# Patient Record
Sex: Female | Born: 1980 | Race: Black or African American | Hispanic: No | Marital: Married | Smoking: Current every day smoker
Health system: Southern US, Community
[De-identification: ages and names within clinical notes are randomized; demographics above are authoritative.]

## PROBLEM LIST (undated history)

## (undated) DIAGNOSIS — E0500 Thyrotoxicosis with diffuse goiter without thyrotoxic crisis or storm: Secondary | ICD-10-CM

## (undated) DIAGNOSIS — I10 Essential (primary) hypertension: Secondary | ICD-10-CM

## (undated) HISTORY — PX: TUBAL LIGATION: SHX77

---

## 2006-08-23 ENCOUNTER — Emergency Department (HOSPITAL_COMMUNITY): Admission: EM | Admit: 2006-08-23 | Discharge: 2006-08-23 | Payer: Self-pay | Admitting: Emergency Medicine

## 2006-09-12 ENCOUNTER — Ambulatory Visit (HOSPITAL_COMMUNITY): Admission: AD | Admit: 2006-09-12 | Discharge: 2006-09-12 | Payer: Self-pay | Admitting: Obstetrics and Gynecology

## 2006-09-23 ENCOUNTER — Inpatient Hospital Stay (HOSPITAL_COMMUNITY): Admission: RE | Admit: 2006-09-23 | Discharge: 2006-09-25 | Payer: Self-pay | Admitting: Obstetrics and Gynecology

## 2006-09-23 ENCOUNTER — Encounter (INDEPENDENT_AMBULATORY_CARE_PROVIDER_SITE_OTHER): Payer: Self-pay | Admitting: *Deleted

## 2009-01-04 ENCOUNTER — Ambulatory Visit (HOSPITAL_COMMUNITY): Admission: RE | Admit: 2009-01-04 | Discharge: 2009-01-04 | Payer: Self-pay | Admitting: Family Medicine

## 2011-01-19 NOTE — Op Note (Signed)
NAME:  Kellie Bautista, Kellie Bautista NO.:  [id number]   MEDICAL RECORD NO.:  [id number]          PATIENT TYPE:  INP   LOCATION:  A401                          FACILITY:  [facility]   PHYSICIAN:  Tilda Burrow, M.D. DATE OF BIRTH:  [birth year]   DATE OF PROCEDURE:  DATE OF DISCHARGE:                               OPERATIVE REPORT   ADMISSION DIAGNOSIS:  Pregnancy, 37 weeks, 6 days.  Repeat cesarean  section with elective sterilization, umbilical hernia postoperative.   DISCHARGE DIAGNOSES:  Pregnancy of 37 weeks, 6 days, delivered.   PROCEDURE:  Repeat low transverse cervical cesarean section, bilateral  salpingectomy.   HISTORY OF PRESENT ILLNESS:  This 30 year old gravida 8, para 6, 0-1-5,  prior C-section x1, VBAC x2, who was admitted for repeat C-section and  tubal ligation after being admitted at 6:00 a.m. on September 23, 2006 in  early labor.  She has had minimal prenatal care.  Saw Dr. __________ in  [place] x2 and in our office once or twice.   PAST MEDICAL HISTORY:  Benign.   PHYSICAL EXAMINATION:  VITAL SIGNS:  Height 5 feet 4 inches.  Weight  150.  GENERAL:  Alert and oriented.  A 36 cm fundal height.  Estimated fetal  weight 5 to 5-1/2 pounds.   HOSPITAL COURSE:  Patient was admitted.  Taken promptly to cesarean  section at 9:00 a.m. on January 21.  Delivering a 4 pound, 10 ounce  female infant.  Apgars 9 and 9.   Postoperative course was uneventful with postop hemoglobin 10,  hematocrit 31, compared to 12.4 and 37 on admission.  Blood gas on the  baby was pH was 7.34, pCO2 51, pO2 18.  Blood type was B+.   Discharged on January 23 for followup in five days, staple removal.      Tilda Burrow, M.D.  Electronically Signed     JVF/MEDQ  D:  09/25/2006  T:  09/25/2006  Job:  [id number]   cc:   Jeoffrey Massed, MD  Fax: [phone]

## 2011-01-19 NOTE — Group Therapy Note (Signed)
[name], [name] NO.:  [id number]   MEDICAL RECORD NO.:  [id number]          PATIENT TYPE:  OIB   LOCATION:  LDR4                          FACILITY:  [facility]   PHYSICIAN:  Tilda Burrow, M.D. DATE OF BIRTH:  Jan 25, 1981   DATE OF PROCEDURE:  DATE OF DISCHARGE:  09/12/2006                                 PROGRESS NOTE   Kellie Bautista is a 30 year old gravida 8, para 6, AB 1 living 5 with an EDC of  October 08, 2005 based on last menstrual period.  She has only had two  visits in our office, which was at 20 and 21 weeks respectively.  She  states that she cannot find her Medicaid card and therefore has not been  back.  I told her that this was not an issue during pregnancy and I left  a message on her answering machine for the secretaries to be expecting  her call regarding an appointment.  She did have a C-section in the  middle of all these babies so she is up for repeat C-section with tubal.  Her chief complaint today is gray discharge and some sharp shooting  vaginal pain.  A speculum exam reveals a yeast infection.  Her cervix is  1 cm just real multiparous and presenting part is at -2 station.  She is  not having any contractions.  The fetal heart rate is reactive.  She is  [redacted] weeks gestation.  She is being discharged home with a prescription  for Diflucan and a diagnosis of most likely some nerve irritation when  the baby moves.      Kellie Bautista, C.N.M.      Tilda Burrow, M.D.  Electronically Signed    FC/MEDQ  D:  09/12/2006  T:  09/13/2006  Job:  [id number]   cc:   [facility]

## 2011-01-19 NOTE — Op Note (Signed)
NAME:  KELCY, LAIBLE NO.:  [id number]   MEDICAL RECORD NO.:  [id number]          PATIENT TYPE:  INP   LOCATION:  A401                          FACILITY:  [facility]   PHYSICIAN:  Tilda Burrow, M.D. DATE OF BIRTH:  1981/04/26   DATE OF PROCEDURE:  09/23/2006  DATE OF DISCHARGE:                               OPERATIVE REPORT   PREOPERATIVE DIAGNOSES:  1. Pregnancy, 37-1/2 weeks' gestation.  2. Prior cesarean section, now for a trial of labor.  3. Early labor.  4. Elective sterilization.   POSTOPERATIVE DIAGNOSES:  1. Pregnancy, 37-1/2 weeks' gestation.  2. Prior cesarean section, now for a trial of labor.  3. Early labor.  4. Elective sterilization.   PROCEDURES:  1. Repeat low transverse cesarean section.  2. Bilateral partial salpingectomy.   SURGEON:  Tilda Burrow, M.D.   ASSISTANTAnnabell Howells, R.N., Whitt, C.S.T.   ANESTHESIA:  Spinal, Glynn Octave, C.R.N.A.   COMPLICATIONS:  Left uterine vein bleeding, controlled during surgery.   FINDINGS:  Thin lower uterine segment, 1-2 mm thickness.  A 4 pound 10.6  ounce female, Apgars 9 and 9.   DETAILS OF PROCEDURE:  The patient was taken to the operating room,  anesthesia introduced, and a Pfannenstiel-type incision repeated with  excision of the old cicatrix.  The fascia was opened in a transverse  fashion, a flap developed, and the peritoneal cavity entered.  The  bladder flap was developed on the lower uterine segment, which was  relatively thinned out.  A transverse incision on the lower uterine  segment was performed at a spot that was no more than 1-2 mm thick.  The  transverse incision using index finger traction was followed by breaking  of the forewaters, clear amniotic fluid encountered, then fetal vertex  expelled by fundal pressure with manual guidance of the vertex.  Cord  was clamped.  There was a body cord x1 as well as a nuchal cord.  The  infant was passed to the waiting pediatrician,  Dr. Milinda Cave, in good  condition.  See his notes for details.  Subsequent weight was 4 pounds  10.6 ounces, Apgars 9 and 9 assigned.   Cord blood samples were obtained.  Placenta delivered by Crede massage.  The uterus was inspected and there was generous bleeding from the left  uterine vein, which was controlled with a Heaney-type clamp and a figure-  of-eight suture.  The lower uterine segment incision was then irrigated,  inspected, and closed using running 0 chromic single-layer closure.  Hemostasis was quite satisfactory.  Chromic 2-0 was used to oversew the  bladder flap.   Tubal ligation:  Tubal ligation was then performed by grasping a  midsegment knuckle of the right tube, elevating it, doubly ligating  around a midsegment knuckle of the tube, and then excising the  incarcerated knuckle of tube for histology.  Hemostasis was confirmed.  The left tube was treated similarly.  The abdomen was irrigated.  There  was a generous amount of intra-abdominal blood from the venous oozing  earlier in the case, and this was irrigated out  generously.  At the end  of this the fluid was relatively clear.  The anterior peritoneum was  inspected.  There were some omental attachments to the anterior  peritoneum suggesting that the peritoneum had not been closed at the  last case.  The pedicles were crossclamped and ligated with 2-0 plain.  The anterior peritoneum was then closed in a continuous running fashion,  the fascia then trimmed of some irregularities and fibrosis and then  closed in a continuous running 0 Vicryl.  The subcutaneous fatty tissues  were inspected, confirmed as adequately hemostatic.  Subcuticular 4-0  Dexon skin closure performed, subcuticular 2-0 plain skin closure x3  sites was performed with interrupted sutures, followed by staple closure  of the entire incision.  The patient tolerated the procedure well and  went to the recovery room in good condition with sponge and  needle  counts correct, with dressing in place.  Foley catheter revealed clear  urine post procedure.      Tilda Burrow, M.D.  Electronically Signed     JVF/MEDQ  D:  09/23/2006  T:  09/23/2006  Job:  [id number]   cc:   Jeoffrey Massed, MD  Fax: [phone]

## 2013-04-21 DIAGNOSIS — E0590 Thyrotoxicosis, unspecified without thyrotoxic crisis or storm: Secondary | ICD-10-CM | POA: Insufficient documentation

## 2013-07-13 DIAGNOSIS — E0500 Thyrotoxicosis with diffuse goiter without thyrotoxic crisis or storm: Secondary | ICD-10-CM | POA: Insufficient documentation

## 2016-02-02 DIAGNOSIS — E049 Nontoxic goiter, unspecified: Secondary | ICD-10-CM | POA: Insufficient documentation

## 2020-12-09 ENCOUNTER — Other Ambulatory Visit: Payer: Self-pay

## 2020-12-09 ENCOUNTER — Emergency Department (HOSPITAL_COMMUNITY)
Admission: EM | Admit: 2020-12-09 | Discharge: 2020-12-09 | Disposition: A | Discharge status: Home / Self Care | Payer: Medicaid - Out of State | Attending: Emergency Medicine | Admitting: Emergency Medicine

## 2020-12-09 ENCOUNTER — Encounter (HOSPITAL_COMMUNITY): Payer: Self-pay | Admitting: Emergency Medicine

## 2020-12-09 ENCOUNTER — Encounter (HOSPITAL_COMMUNITY): Payer: Self-pay

## 2020-12-09 ENCOUNTER — Emergency Department (HOSPITAL_COMMUNITY)
Admission: EM | Admit: 2020-12-09 | Discharge: 2020-12-10 | Disposition: A | Discharge status: Home / Self Care | Payer: Medicaid - Out of State | Source: Home / Self Care

## 2020-12-09 DIAGNOSIS — M542 Cervicalgia: Secondary | ICD-10-CM | POA: Insufficient documentation

## 2020-12-09 DIAGNOSIS — M79601 Pain in right arm: Secondary | ICD-10-CM | POA: Insufficient documentation

## 2020-12-09 DIAGNOSIS — I10 Essential (primary) hypertension: Secondary | ICD-10-CM | POA: Insufficient documentation

## 2020-12-09 DIAGNOSIS — R42 Dizziness and giddiness: Secondary | ICD-10-CM | POA: Insufficient documentation

## 2020-12-09 DIAGNOSIS — R531 Weakness: Secondary | ICD-10-CM | POA: Insufficient documentation

## 2020-12-09 DIAGNOSIS — Z5321 Procedure and treatment not carried out due to patient leaving prior to being seen by health care provider: Secondary | ICD-10-CM | POA: Insufficient documentation

## 2020-12-09 DIAGNOSIS — R519 Headache, unspecified: Secondary | ICD-10-CM | POA: Diagnosis present

## 2020-12-09 HISTORY — DX: Essential (primary) hypertension: I10

## 2020-12-09 HISTORY — DX: Thyrotoxicosis with diffuse goiter without thyrotoxic crisis or storm: E05.00

## 2020-12-09 NOTE — ED Triage Notes (Signed)
Patient here from home reporting ongoing headache and hypertension. Reports that she does not take meds. Patient does not talk much.

## 2020-12-09 NOTE — ED Notes (Signed)
Pt denies dizziness at this time. States she is having a headache. Reports it feels like previous headaches she has had when her BP goes up.

## 2020-12-09 NOTE — ED Triage Notes (Signed)
Pt reports that her BP read high, became dizzy, headache, pain in neck, pain to right arm, generalized weakness.  Started about 8-9pm. History of high BP and is not taking any medication for it.

## 2020-12-10 ENCOUNTER — Ambulatory Visit (HOSPITAL_COMMUNITY): Payer: Medicaid - Out of State

## 2020-12-10 ENCOUNTER — Emergency Department (HOSPITAL_COMMUNITY): Payer: Medicaid - Out of State

## 2020-12-10 ENCOUNTER — Ambulatory Visit (HOSPITAL_COMMUNITY): Admission: RE | Admit: 2020-12-10 | Payer: Medicaid - Out of State | Source: Ambulatory Visit

## 2020-12-10 LAB — BASIC METABOLIC PANEL
Anion gap: 8 (ref 5–15)
BUN: 8 mg/dL (ref 6–20)
CO2: 25 mmol/L (ref 22–32)
Calcium: 9.3 mg/dL (ref 8.9–10.3)
Chloride: 104 mmol/L (ref 98–111)
Creatinine, Ser: 0.76 mg/dL (ref 0.44–1.00)
GFR, Estimated: >60 mL/min (ref >60)
Glucose, Bld: 98 mg/dL (ref 70–99)
Potassium: 3.3 mmol/L — ABNORMAL LOW (ref 3.5–5.1)
Sodium: 137 mmol/L (ref 135–145)

## 2020-12-10 LAB — URINALYSIS, ROUTINE W REFLEX MICROSCOPIC
Bacteria, UA: NONE SEEN
Bilirubin Urine: NEGATIVE
Glucose, UA: NEGATIVE mg/dL
Ketones, ur: NEGATIVE mg/dL
Nitrite: NEGATIVE
Protein, ur: NEGATIVE mg/dL
RBC / HPF: >50 RBC/hpf — ABNORMAL HIGH (ref 0–5)
Specific Gravity, Urine: 1.006 (ref 1.005–1.030)
pH: 7 (ref 5.0–8.0)

## 2020-12-10 LAB — CBC
HCT: 40.4 % (ref 36.0–46.0)
Hemoglobin: 13.6 g/dL (ref 12.0–15.0)
MCH: 27.9 pg (ref 26.0–34.0)
MCHC: 33.7 g/dL (ref 30.0–36.0)
MCV: 82.8 fL (ref 80.0–100.0)
Platelets: 226 10*3/uL (ref 150–400)
RBC: 4.88 MIL/uL (ref 3.87–5.11)
RDW: 13.9 % (ref 11.5–15.5)
WBC: 8.3 10*3/uL (ref 4.0–10.5)
nRBC: 0 % (ref 0.0–0.2)

## 2020-12-10 LAB — I-STAT BETA HCG BLOOD, ED (MC, WL, AP ONLY): I-stat hCG, quantitative: <5 m[IU]/mL (ref <5)

## 2020-12-10 MED ORDER — SODIUM CHLORIDE 0.9% FLUSH: 3.0000 mL | Freq: Once | INTRAVENOUS | Status: DC

## 2021-04-05 ENCOUNTER — Encounter (HOSPITAL_COMMUNITY): Payer: Self-pay

## 2021-04-05 ENCOUNTER — Other Ambulatory Visit: Payer: Self-pay

## 2021-04-05 ENCOUNTER — Emergency Department (HOSPITAL_COMMUNITY): Payer: Medicaid - Out of State

## 2021-04-05 ENCOUNTER — Emergency Department (HOSPITAL_COMMUNITY)
Admission: EM | Admit: 2021-04-05 | Discharge: 2021-04-05 | Disposition: A | Discharge status: Home / Self Care | Payer: Medicaid - Out of State | Attending: Emergency Medicine | Admitting: Emergency Medicine

## 2021-04-05 DIAGNOSIS — F17200 Nicotine dependence, unspecified, uncomplicated: Secondary | ICD-10-CM | POA: Insufficient documentation

## 2021-04-05 DIAGNOSIS — I10 Essential (primary) hypertension: Secondary | ICD-10-CM | POA: Insufficient documentation

## 2021-04-05 DIAGNOSIS — R519 Headache, unspecified: Secondary | ICD-10-CM | POA: Diagnosis present

## 2021-04-05 LAB — CBC WITH DIFFERENTIAL/PLATELET
Abs Immature Granulocytes: 0.02 10*3/uL (ref 0.00–0.07)
Basophils Absolute: 0.1 10*3/uL (ref 0.0–0.1)
Basophils Relative: 1 %
Eosinophils Absolute: 0.3 10*3/uL (ref 0.0–0.5)
Eosinophils Relative: 4 %
HCT: 38.4 % (ref 36.0–46.0)
Hemoglobin: 12.7 g/dL (ref 12.0–15.0)
Immature Granulocytes: 0 %
Lymphocytes Relative: 43 %
Lymphs Abs: 3.3 10*3/uL (ref 0.7–4.0)
MCH: 27.4 pg (ref 26.0–34.0)
MCHC: 33.1 g/dL (ref 30.0–36.0)
MCV: 82.9 fL (ref 80.0–100.0)
Monocytes Absolute: 0.7 10*3/uL (ref 0.1–1.0)
Monocytes Relative: 9 %
Neutro Abs: 3.3 10*3/uL (ref 1.7–7.7)
Neutrophils Relative %: 43 %
Platelets: 255 10*3/uL (ref 150–400)
RBC: 4.63 MIL/uL (ref 3.87–5.11)
RDW: 13.6 % (ref 11.5–15.5)
WBC: 7.7 10*3/uL (ref 4.0–10.5)
nRBC: 0 % (ref 0.0–0.2)

## 2021-04-05 LAB — COMPREHENSIVE METABOLIC PANEL
ALT: 19 U/L (ref 0–44)
AST: 21 U/L (ref 15–41)
Albumin: 3.9 g/dL (ref 3.5–5.0)
Alkaline Phosphatase: 62 U/L (ref 38–126)
Anion gap: 5 (ref 5–15)
BUN: 7 mg/dL (ref 6–20)
CO2: 29 mmol/L (ref 22–32)
Calcium: 9 mg/dL (ref 8.9–10.3)
Chloride: 104 mmol/L (ref 98–111)
Creatinine, Ser: 0.82 mg/dL (ref 0.44–1.00)
GFR, Estimated: >60 mL/min (ref >60)
Glucose, Bld: 121 mg/dL — ABNORMAL HIGH (ref 70–99)
Potassium: 3.5 mmol/L (ref 3.5–5.1)
Sodium: 138 mmol/L (ref 135–145)
Total Bilirubin: 0.7 mg/dL (ref 0.3–1.2)
Total Protein: 7.1 g/dL (ref 6.5–8.1)

## 2021-04-05 LAB — I-STAT BETA HCG BLOOD, ED (MC, WL, AP ONLY): I-stat hCG, quantitative: <5 m[IU]/mL (ref <5)

## 2021-04-05 MED ORDER — AMLODIPINE BESYLATE 5 MG PO TABS: 5.0000 mg | ORAL_TABLET | Freq: Every day | ORAL | 0 refills | Status: DC

## 2021-04-05 MED ORDER — AMLODIPINE BESYLATE 5 MG PO TABS
5.0000 mg | ORAL_TABLET | Freq: Once | ORAL | Status: AC
  Administered 2021-04-05: 5 mg via ORAL
  Filled 2021-04-05: qty 1

## 2021-04-05 NOTE — ED Provider Notes (Signed)
[facility] EMERGENCY DEPARTMENT Provider Note   CSN: [id number] Arrival date & time: 04/05/21  1556     History No chief complaint on file.   Kellie Bautista is a 40 y.o. female.  HPI She is here because she is checking her blood pressure at home and it has been high.  She states she was on blood pressure medicine of unknown type until about a month ago and then ran out.  She does not have an active [facility] care doctor to prescribe her medications.  She has a history of hyperthyroidism and Graves' disease.  She is not currently being followed or managed for that.  She has intermittent headache.  She denies nausea, vomiting, weakness or dizziness.  There are no active modifying factors.  Past Medical History:  Diagnosis Date   Graves disease    Hypertension     There are no problems to display for this patient.   Past Surgical History:  Procedure Laterality Date   CESAREAN SECTION       OB History   No obstetric history on file.     No family history on file.  Social History   Tobacco Use   Smoking status: Every Day   Smokeless tobacco: Never  Vaping Use   Vaping Use: Never used  Substance Use Topics   Alcohol use: Never   Drug use: Never    Home Medications Prior to Admission medications   Medication Sig Start Date End Date Taking? Authorizing Provider  amLODipine (NORVASC) 5 MG tablet Take 1 tablet (5 mg total) by mouth daily. 04/05/21  Yes Daleen Bo, MD    Allergies    Patient has no known allergies.  Review of Systems   Review of Systems  All other systems reviewed and are negative.  Physical Exam Updated Vital Signs BP (!) 156/98 (BP Location: Left Arm)   Pulse 68   Temp 98.6 F (37 C) (Oral)   Resp 15   SpO2 99%   Physical Exam Vitals and nursing note reviewed.  Constitutional:      General: She is not in acute distress.    Appearance: She is well-developed. She is not ill-appearing, toxic-appearing or diaphoretic.   HENT:     Head: Normocephalic and atraumatic.     Right Ear: External ear normal.     Left Ear: External ear normal.  Eyes:     Conjunctiva/sclera: Conjunctivae normal.     Pupils: Pupils are equal, round, and reactive to light.  Neck:     Trachea: Phonation normal.  Cardiovascular:     Rate and Rhythm: Normal rate and regular rhythm.     Heart sounds: Normal heart sounds.  Pulmonary:     Effort: Pulmonary effort is normal.     Breath sounds: Normal breath sounds.  Abdominal:     General: There is no distension.     Palpations: Abdomen is soft.     Tenderness: There is no abdominal tenderness.  Musculoskeletal:        General: Normal range of motion.     Cervical back: Normal range of motion and neck supple.  Skin:    General: Skin is warm and dry.  Neurological:     Mental Status: She is alert and oriented to person, place, and time.     Cranial Nerves: No cranial nerve deficit.     Sensory: No sensory deficit.     Motor: No abnormal muscle tone.     Coordination: Coordination  normal.     Comments: No dysarthria or aphasia.  Psychiatric:        Mood and Affect: Mood normal.        Behavior: Behavior normal.        Thought Content: Thought content normal.        Judgment: Judgment normal.    ED Results / Procedures / Treatments   Labs (all labs ordered are listed, but only abnormal results are displayed) Labs Reviewed  COMPREHENSIVE METABOLIC PANEL - Abnormal; Notable for the following components:      Result Value   Glucose, Bld 121 (*)    All other components within normal limits  CBC WITH DIFFERENTIAL/PLATELET  I-STAT BETA HCG BLOOD, ED ([facility], [facility], [facility] ONLY)    EKG None  Radiology CT HEAD WO CONTRAST (5MM)  Result Date: 04/05/2021 CLINICAL DATA:  Dizziness.  Hypertension EXAM: CT HEAD WITHOUT CONTRAST TECHNIQUE: Contiguous axial images were obtained from the base of the skull through the vertex without intravenous contrast. COMPARISON:  None. FINDINGS: Brain:  No evidence of acute infarction, hemorrhage, hydrocephalus, extra-axial collection or mass lesion/mass effect. Vascular: Negative for hyperdense vessel Skull: Negative Sinuses/Orbits: Mucosal edema paranasal sinuses. Air-fluid level left maxillary sinus. Negative orbit Other: None IMPRESSION: Normal CT of the brain Sinus mucosal disease. Electronically Signed   By: Franchot Gallo M.D.   On: 04/05/2021 19:11    Procedures Procedures   Medications Ordered in ED Medications  amLODipine (NORVASC) tablet 5 mg (5 mg Oral Given 04/05/21 2048)    ED Course  I have reviewed the triage vital signs and the nursing notes.  Pertinent labs & imaging results that were available during my care of the patient were reviewed by me and considered in my medical decision making (see chart for details).  Clinical Course as of 04/06/21 1150  Wed Apr 05, 2021  1949 HCT: 38.4 [EW]    Clinical Course User Index [EW] Daleen Bo, MD   MDM Rules/Calculators/A&P                            Patient Vitals for the past 24 hrs:  BP Temp Temp src Pulse Resp SpO2  04/05/21 2123 (!) 156/98 98.6 F (37 C) Oral 68 15 99 %  04/05/21 1610 (!) 157/97 98.8 F (37.1 C) Oral 94 16 99 %    At the time of discharge- reevaluation with update and discussion. After initial assessment and treatment, an updated evaluation reveals she is comfortable and has no further complaints. Daleen Bo   Medical Decision Making:  This patient is presenting for evaluation of headache and high blood pressure, which does require a range of treatment options, and is a complaint that involves a moderate risk of morbidity and mortality. The differential diagnoses include hypertensive urgency, tension headache, chronic hypertension. I decided to review old records, and in summary middle-aged female presenting with headache and elevated blood pressure.  She is not taking her usual prescribed medication..  I do not require additional historical  information from anyone.  Clinical Laboratory Tests Ordered, included CBC and Metabolic panel. Review indicates normal except glucose high. Radiologic Tests Ordered, included CT head.  I independently Visualized: Radiographic images, which show no acute abnormalities   Critical Interventions-clinical evaluation, laboratory testing, observation, medication treatment  After These Interventions, the Patient was reevaluated and was found stable for discharge.  Patient with mild headache and hypertension however no signs of hypertensive urgency.  Will  initiate treatment with Norvasc, and encouraged her to follow-up with PCP soon as possible.  She may need additional medication and/or monitoring.  CRITICAL CARE-no Performed by: Daleen Bo  Nursing Notes Reviewed/ Care Coordinated Applicable Imaging Reviewed Interpretation of Laboratory Data incorporated into ED treatment  The patient appears reasonably screened and/or stabilized for discharge and I doubt any other medical condition or other [facility] requiring further screening, evaluation, or treatment in the ED at this time prior to discharge.  Plan: Home Medications-OTC as needed; Home Treatments-low-salt diet; return here if the recommended treatment, does not improve the symptoms; Recommended follow up-PCP follow-up for ongoing management     Final Clinical Impression(s) / ED Diagnoses Final diagnoses:  Hypertension, unspecified type    Rx / DC Orders ED Discharge Orders          Ordered    amLODipine (NORVASC) 5 MG tablet  Daily        04/05/21 2054             Daleen Bo, MD 04/06/21 1152

## 2021-04-05 NOTE — Discharge Instructions (Addendum)
Stay on a low-salt diet.  Use the resource guide to help you find a doctor to see for blood pressure treatment.  You will need to see that doctor before your prescription runs out.  It is important to get your thyroid checked since it has been high in the past.  Make sure that you have your new doctor check it as soon as possible.  See the attached list to help you find a doctor.  Return here, if needed.

## 2021-04-05 NOTE — ED Provider Notes (Signed)
Emergency Medicine Provider Triage Evaluation Note  Kellie Bautista , a 40 y.o. female  was evaluated in triage.  Pt complains of headache.  Patient states a few days ago she noticed that her blood pressure was very high, [id number] systolic.  Since then, she has had severe pain in the right side of her head, photophobia, nausea.  She is been out of her blood pressure medicine for several weeks.  No chest pain or shortness of breath.  Review of Systems  Positive: HA Negative: CP  Physical Exam  BP (!) 157/97   Pulse 94   Temp 98.8 F (37.1 C) (Oral)   Resp 16   SpO2 99%  Gen:   Awake, no distress   Resp:  Normal effort  MSK:   Moves extremities without difficulty  Other:  CN intact.  Strength and sensation intact x4.  Medical Decision Making  Medically screening exam initiated at 4:48 PM.  Appropriate orders placed.  Kellie Bautista was informed that the remainder of the evaluation will be completed by another provider, this initial triage assessment does not replace that evaluation, and the importance of remaining in the ED until their evaluation is complete.  Labs, ct head   Franchot Heidelberg, PA-C 04/05/21 1649    Daleen Bo, MD 04/06/21 1158

## 2021-04-05 NOTE — ED Triage Notes (Signed)
Patient complains of BP being high for several days. Has been out of BP meds for 3 weeks. Has had occipital headache x 5 days with pain radiating to right eye and right side of face. Patient alert and oriented.

## 2021-07-14 ENCOUNTER — Other Ambulatory Visit: Payer: Self-pay

## 2021-07-14 ENCOUNTER — Emergency Department (HOSPITAL_COMMUNITY)
Admission: EM | Admit: 2021-07-14 | Discharge: 2021-07-14 | Disposition: A | Discharge status: Home Health | Payer: Medicaid - Out of State | Attending: Emergency Medicine | Admitting: Emergency Medicine

## 2021-07-14 DIAGNOSIS — I10 Essential (primary) hypertension: Secondary | ICD-10-CM | POA: Insufficient documentation

## 2021-07-14 DIAGNOSIS — Z20822 Contact with and (suspected) exposure to covid-19: Secondary | ICD-10-CM | POA: Diagnosis not present

## 2021-07-14 DIAGNOSIS — B349 Viral infection, unspecified: Secondary | ICD-10-CM | POA: Insufficient documentation

## 2021-07-14 DIAGNOSIS — Z79899 Other long term (current) drug therapy: Secondary | ICD-10-CM | POA: Insufficient documentation

## 2021-07-14 DIAGNOSIS — F17200 Nicotine dependence, unspecified, uncomplicated: Secondary | ICD-10-CM | POA: Insufficient documentation

## 2021-07-14 DIAGNOSIS — J029 Acute pharyngitis, unspecified: Secondary | ICD-10-CM | POA: Diagnosis present

## 2021-07-14 LAB — RESP PANEL BY RT-PCR (FLU A&B, COVID) ARPGX2
Influenza A by PCR: NEGATIVE
Influenza B by PCR: NEGATIVE
SARS Coronavirus 2 by RT PCR: NEGATIVE

## 2021-07-14 LAB — GROUP A STREP BY PCR: Group A Strep by PCR: NOT DETECTED

## 2021-07-14 MED ORDER — BENZONATATE 100 MG PO CAPS: 100.0000 mg | ORAL_CAPSULE | Freq: Three times a day (TID) | ORAL | 0 refills | Status: DC

## 2021-07-14 MED ORDER — IBUPROFEN 800 MG PO TABS: 800.0000 mg | ORAL_TABLET | Freq: Three times a day (TID) | ORAL | 0 refills | Status: DC

## 2021-07-14 MED ORDER — LIDOCAINE VISCOUS HCL 2 % MT SOLN
15.0000 mL | Freq: Once | OROMUCOSAL | Status: AC
  Administered 2021-07-14: 15 mL via OROMUCOSAL
  Filled 2021-07-14: qty 15

## 2021-07-14 MED ORDER — DEXAMETHASONE SODIUM PHOSPHATE 10 MG/ML IJ SOLN
10.0000 mg | Freq: Once | INTRAMUSCULAR | Status: AC
  Administered 2021-07-14: 10 mg via INTRAMUSCULAR
  Filled 2021-07-14: qty 1

## 2021-07-14 MED ORDER — CEPACOL REGULAR STRENGTH 3 MG MT LOZG: 1.0000 | LOZENGE | OROMUCOSAL | 12 refills | Status: DC | PRN

## 2021-07-14 NOTE — ED Triage Notes (Signed)
Pt with sore throat and generalized body aches since Tuesday. Took ibuprofen last night with some relief.

## 2021-07-14 NOTE — ED Provider Notes (Signed)
[facility] EMERGENCY DEPARTMENT Provider Note   CSN: [id number] Arrival date & time: 07/14/21  5102     History Chief Complaint  Patient presents with   Sore Throat   Generalized Body Aches    Kellie Bautista is a 40 y.o. female.  With past medical history of Graves' disease and hypertension who presents emergency department with sore throat.  She states on Tuesday she began having a mild sore throat.  States that by the evening she had a fever of 10 1-1 02.  She states that on Wednesday she fevered again.  She states on Thursday she initially felt better and then her sore throat increased significantly.  States that she has had pain with swallowing.  She now endorses cough, body aches, headache.  She states that she took ibuprofen this morning with mild relief of symptoms.  She denies any shortness of breath or sick contacts.  Denies chest pain, sinus pressure or pain, ear pain.   Sore Throat Pertinent negatives include no chest pain, no abdominal pain and no shortness of breath.      Past Medical History:  Diagnosis Date   Graves disease    Hypertension     There are no problems to display for this patient.   Past Surgical History:  Procedure Laterality Date   CESAREAN SECTION       OB History   No obstetric history on file.     No family history on file.  Social History   Tobacco Use   Smoking status: Every Day   Smokeless tobacco: Never  Vaping Use   Vaping Use: Never used  Substance Use Topics   Alcohol use: Never   Drug use: Never    Home Medications Prior to Admission medications   Medication Sig Start Date End Date Taking? Authorizing Provider  amLODipine (NORVASC) 5 MG tablet Take 1 tablet (5 mg total) by mouth daily. 04/05/21   Daleen Bo, MD    Allergies    Patient has no known allergies.  Review of Systems   Review of Systems  Constitutional:  Positive for fever.  HENT:  Positive for congestion, rhinorrhea,  sneezing, sore throat and trouble swallowing. Negative for ear pain, sinus pressure and sinus pain.   Respiratory:  Positive for cough. Negative for shortness of breath and wheezing.   Cardiovascular:  Negative for chest pain.  Gastrointestinal:  Negative for abdominal pain.  Musculoskeletal:  Positive for myalgias.  All other systems reviewed and are negative.  Physical Exam Updated Vital Signs BP (!) 163/99   Pulse 85   Temp 98.8 F (37.1 C) (Oral)   Resp 14   LMP 07/07/2021 (Exact Date)   SpO2 98%   Physical Exam Vitals and nursing note reviewed.  Constitutional:      General: She is not in acute distress.    Appearance: Normal appearance. She is well-developed. She is ill-appearing. She is not toxic-appearing.  HENT:     Head: Normocephalic and atraumatic.     Nose: Congestion and rhinorrhea present.     Mouth/Throat:     Mouth: Mucous membranes are moist.     Pharynx: Uvula midline. Pharyngeal swelling and posterior oropharyngeal erythema present. No oropharyngeal exudate or uvula swelling.     Tonsils: No tonsillar exudate. 1+ on the right. 1+ on the left.  Eyes:     General: No scleral icterus.    Conjunctiva/sclera: Conjunctivae normal.     Pupils: Pupils are equal, round, and reactive  to light.  Cardiovascular:     Rate and Rhythm: Normal rate and regular rhythm.     Heart sounds: Normal heart sounds. No murmur heard. Pulmonary:     Effort: Pulmonary effort is normal. No respiratory distress.     Breath sounds: Normal breath sounds.  Abdominal:     General: Bowel sounds are normal.     Palpations: Abdomen is soft.  Musculoskeletal:        General: Normal range of motion.     Cervical back: Normal range of motion and neck supple.  Lymphadenopathy:     Cervical: Cervical adenopathy present.  Skin:    General: Skin is warm and dry.     Capillary Refill: Capillary refill takes less than 2 seconds.  Neurological:     General: No focal deficit present.      Mental Status: She is alert and oriented to person, place, and time.  Psychiatric:        Mood and Affect: Mood normal.        Behavior: Behavior normal.    ED Results / Procedures / Treatments   Labs (all labs ordered are listed, but only abnormal results are displayed) Labs Reviewed  GROUP A STREP BY PCR  RESP PANEL BY RT-PCR (FLU A&B, COVID) [id number]   EKG None  Radiology No results found.  Procedures Procedures   Medications Ordered in ED Medications  dexamethasone (DECADRON) injection 10 mg (10 mg Intramuscular Given 07/14/21 1019)  lidocaine (XYLOCAINE) 2 % viscous mouth solution 15 mL (15 mLs Mouth/Throat Given 07/14/21 1019)    ED Course  I have reviewed the triage vital signs and the nursing notes.  Pertinent labs & imaging results that were available during my care of the patient were reviewed by me and considered in my medical decision making (see chart for details).    MDM Rules/Calculators/A&P 40 year old female who presents emergency department with sore throat, myalgias, fever.  Strep negative Given Decadron 10 mg IM and viscous lidocaine for relief of symptoms COVID and flu pending Likely viral upper respiratory illness. Lungs are clear bilaterally, we will not proceed with plain films. She is nontoxic in appearance, would not benefit from lab work at this time.  Instructed to push fluids, rest, Tylenol and ibuprofen for relief of symptoms.  Can use over-the-counter cough and cold medications.  Structured return to emergency department should she begin having shortness of breath, inability to swallow, high fever not responsive to antipyretics. Safe for discharge  Final Clinical Impression(s) / ED Diagnoses Final diagnoses:  Viral illness    Rx / DC Orders ED Discharge Orders          Ordered    benzonatate (TESSALON) 100 MG capsule  Every 8 hours        07/14/21 1144    ibuprofen (ADVIL) 800 MG tablet  3 times daily        07/14/21 1144     menthol-cetylpyridinium (CEPACOL REGULAR STRENGTH) 3 MG lozenge  As needed        07/14/21 1144             Mickie Hillier, PA-C 07/14/21 1146    Pattricia Boss, MD 07/15/21 [id number]

## 2021-11-25 ENCOUNTER — Emergency Department (HOSPITAL_COMMUNITY): Payer: Medicaid - Out of State

## 2021-11-25 ENCOUNTER — Emergency Department (HOSPITAL_COMMUNITY)
Admission: EM | Admit: 2021-11-25 | Discharge: 2021-11-25 | Disposition: A | Discharge status: Home / Self Care | Payer: Medicaid - Out of State | Attending: Emergency Medicine | Admitting: Emergency Medicine

## 2021-11-25 ENCOUNTER — Other Ambulatory Visit: Payer: Self-pay

## 2021-11-25 ENCOUNTER — Encounter (HOSPITAL_COMMUNITY): Payer: Self-pay | Admitting: Emergency Medicine

## 2021-11-25 DIAGNOSIS — Z79899 Other long term (current) drug therapy: Secondary | ICD-10-CM | POA: Diagnosis not present

## 2021-11-25 DIAGNOSIS — R519 Headache, unspecified: Secondary | ICD-10-CM

## 2021-11-25 DIAGNOSIS — I10 Essential (primary) hypertension: Secondary | ICD-10-CM | POA: Insufficient documentation

## 2021-11-25 MED ORDER — ACETAMINOPHEN 325 MG PO TABS
650.0000 mg | ORAL_TABLET | Freq: Once | ORAL | Status: AC
Start: 2021-11-25 — End: 2021-11-25
  Administered 2021-11-25: 650 mg via ORAL
  Filled 2021-11-25: qty 2

## 2021-11-25 MED ORDER — SODIUM CHLORIDE 0.9 % IV BOLUS: 1000.0000 mL | Freq: Once | INTRAVENOUS | Status: DC

## 2021-11-25 MED ORDER — ONDANSETRON HCL 4 MG/2ML IJ SOLN
4.0000 mg | Freq: Once | INTRAMUSCULAR | Status: DC
  Filled 2021-11-25: qty 2

## 2021-11-25 MED ORDER — AMLODIPINE BESYLATE 5 MG PO TABS: 5.0000 mg | ORAL_TABLET | Freq: Every day | ORAL | 0 refills | Status: DC

## 2021-11-25 MED ORDER — AMLODIPINE BESYLATE 5 MG PO TABS
5.0000 mg | ORAL_TABLET | Freq: Once | ORAL | Status: AC
Start: 2021-11-25 — End: 2021-11-25
  Administered 2021-11-25: 5 mg via ORAL
  Filled 2021-11-25: qty 1

## 2021-11-25 NOTE — ED Provider Notes (Addendum)
?[facility] ?Provider Note ? ? ?CSN: [id number] ?Arrival date & time: 11/25/21  1205 ? ?  ? ?History ? ?Chief Complaint  ?Patient presents with  ? Headache  ? ? ?Kellie Bautista is a 41 y.o. female with a past medical history of hypertension who presents to the emergency department complaining of headache onset today prior to arrival.  Patient notes that she ate [employer] prior to arrival. Has not tried medication for her symptoms.  She has been out of her blood pressure medication for 2 weeks.  Denies chest pain, shortness of breath, fever, chills, dysuria, hematuria. Pt notes that it is typical for her to have headaches with her elevated blood pressure. She doesn't check her blood pressure at home as she should. ? ? ? ?The history is provided by the patient. No language interpreter was used.  ? ?  ? ?Home Medications ?Prior to Admission medications   ?Medication Sig Start Date End Date Taking? Authorizing Provider  ?amLODipine (NORVASC) 5 MG tablet Take 1 tablet (5 mg total) by mouth daily. 11/25/21 12/25/21  Kay Ricciuti A, PA-C  ?benzonatate (TESSALON) 100 MG capsule Take 1 capsule (100 mg total) by mouth every 8 (eight) hours. 07/14/21   Mickie Hillier, PA-C  ?ibuprofen (ADVIL) 800 MG tablet Take 1 tablet (800 mg total) by mouth 3 (three) times daily. 07/14/21   Mickie Hillier, PA-C  ?menthol-cetylpyridinium (CEPACOL REGULAR STRENGTH) 3 MG lozenge Take 1 lozenge (3 mg total) by mouth as needed for sore throat. 07/14/21   Mickie Hillier, PA-C  ?   ? ?Allergies    ?Patient has no known allergies.   ? ?Review of Systems   ?Review of Systems  ?Constitutional:  Negative for chills and fever.  ?Eyes:  Negative for visual disturbance.  ?Respiratory:  Negative for shortness of breath.   ?Cardiovascular:  Negative for chest pain.  ?Genitourinary:  Negative for dysuria and hematuria.  ?Neurological:  Positive for headaches.  ?All other systems reviewed and are negative. ? ?Physical  Exam ?Updated Vital Signs ?BP (!) 167/103   Pulse 68   Temp 98.8 ?F (37.1 ?C) (Oral)   Resp 16   LMP 11/24/2021   SpO2 100%  ?Physical Exam ?Vitals and nursing note reviewed.  ?Constitutional:   ?   General: She is not in acute distress. ?   Appearance: She is not diaphoretic.  ?HENT:  ?   Head: Normocephalic and atraumatic.  ?   Mouth/Throat:  ?   Pharynx: No oropharyngeal exudate.  ?Eyes:  ?   General: No scleral icterus. ?   Conjunctiva/sclera: Conjunctivae normal.  ?Cardiovascular:  ?   Rate and Rhythm: Normal rate and regular rhythm.  ?   Pulses: Normal pulses.  ?   Heart sounds: Normal heart sounds.  ?Pulmonary:  ?   Effort: Pulmonary effort is normal. No respiratory distress.  ?   Breath sounds: Normal breath sounds. No wheezing.  ?Abdominal:  ?   General: Bowel sounds are normal.  ?   Palpations: Abdomen is soft. There is no mass.  ?   Tenderness: There is no abdominal tenderness. There is no guarding or rebound.  ?Musculoskeletal:     ?   General: Normal range of motion.  ?   Cervical back: Normal range of motion and neck supple.  ?   Comments: Strength and sensation intact to bilateral upper and lower extremities.  ?Skin: ?   General: Skin is warm and dry.  ?Neurological:  ?  General: No focal deficit present.  ?   Mental Status: She is alert.  ?   Cranial Nerves: Cranial nerves 2-12 are intact.  ?   Sensory: Sensation is intact.  ?   Motor: Motor function is intact. No pronator drift.  ?Psychiatric:     ?   Behavior: Behavior normal.  ? ? ?ED Results / Procedures / Treatments   ?Labs ?(all labs ordered are listed, but only abnormal results are displayed) ?Labs Reviewed - No data to display ? ? ?EKG ?None ? ?Radiology ?CT Head Wo Contrast ? ?Result Date: 11/25/2021 ?CLINICAL DATA:  Headaches with increasing frequency and new features. EXAM: CT HEAD WITHOUT CONTRAST TECHNIQUE: Contiguous axial images were obtained from the base of the skull through the vertex without intravenous contrast. RADIATION  DOSE REDUCTION: This exam was performed according to the departmental dose-optimization program which includes automated exposure control, adjustment of the mA and/or kV according to patient size and/or use of iterative reconstruction technique. COMPARISON:  04/05/2021 FINDINGS: Brain: No evidence of intracranial hemorrhage, acute infarction, hydrocephalus, extra-axial collection, or mass lesion/mass effect. Vascular:  No hyperdense vessel or other acute findings. Skull: No evidence of fracture or other significant bone abnormality. Sinuses/Orbits: No acute findings. Mucosal thickening is again seen involving the bilateral ethmoid and right maxillary sinuses. Other: None. IMPRESSION: No intracranial abnormality. Chronic sinusitis. Electronically Signed   By: Marlaine Hind M.D.   On: 11/25/2021 13:29   ? ?Procedures ?Procedures  ? ? ?Medications Ordered in ED ?Medications  ?amLODipine (NORVASC) tablet 5 mg (5 mg Oral Given 11/25/21 1415)  ?acetaminophen (TYLENOL) tablet 650 mg (650 mg Oral Given 11/25/21 1425)  ? ? ?ED Course/ Medical Decision Making/ A&P ?Clinical Course as of 11/25/21 1520  ?Sat Nov 25, 2021  ?1420 Pt re-evaluated and noted that her symptoms improved in the ED. Pt declines IVF, zofran, or lab workup at this time, noting "I could be out making money with [employer] right now, I'm fine." Discussed discharge treatment plan with patient at bedside. Pt appears safe for discharge.  [SB]  ?  ?Clinical Course User Index ?[SB] Oneika Simonian A, PA-C  ? ?                        ?Medical Decision Making ?Amount and/or Complexity of Data Reviewed ?Labs: ordered. ?Radiology: ordered. ? ?Risk ?OTC drugs. ?Prescription drug management. ? ? ?Pt presented to the ED with headache onset today. Pt not compliant with her antihypertensives, has been out of her amlodipine for several weeks.  Denies chest pain, urinary symptoms, or shortness of breath. Pt denies vision changes on my exam. Initial blood pressure elevated at  172/109 on arrival. Vital signs otherwise stable. On exam, pt with no acute cardiovascular, respiratory, or abdominal exam findings. No focal neuro deficits. No vision changes.  No worse headache of life sensation per patient. Pt notes that it is normal for her to have headaches when her blood pressure is elevated. Differential diagnosis includes SAH, ICH, Hypertensive urgency, Hypertensive emergency, migraine, tension headache.  ? ?Imaging: ?I ordered imaging studies including CT head wo obtained. ?I independently visualized and interpreted imaging which showed: no intracranial abnormality ?I agree with the radiologist interpretation ? ?Medications:  ?I ordered medication including tylenol and norvasc for symptom management. ?Reevaluation of the patient after these medicines and interventions, I reevaluated the patient and found that they have improved ?I have reviewed the patients home medicines and have made adjustments as needed ? ? ? ?  Disposition: ?Patient presentation suspicious for headache in the setting of noncompliance with antihypertensives. Prior to labs being drawn and after CT scan completed, patient notes that her symptoms have improved and she would like to go home.  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 After consideration of the diagnostic results and the patients response to treatment, I feel that the patient would benefit from Discharge home. Will provide patient with 30-day prescription of Norvasc and primary care resources for follow up and further management of hypertension. Stressed importance of taking hypertension medications as prescribed and getting a follow up appointment with a primary care provider. Discussed supportive care measures and strict return precautions with patient consisting of  increasing persistent chest pain, increasing persistent shortness of breath, sudden onset headache.  Pt acknowledges and verbalizes understanding and is agreeable to plan. Patient appears safe for discharge at [facility]

## 2021-11-25 NOTE — ED Triage Notes (Signed)
Pt states she has been out of BP medication x 2 weeks.  Ate [employer] just prior to arrival and reports headache and blurred vision.  No arm drift.  Speech clear. ?

## 2021-11-25 NOTE — Discharge Instructions (Addendum)
It was a pleasure taking care of you today!  ? ?Your blood pressure was treated with a dose of your at home med (Norvasc).  You will be sent a 30-day prescription of your amlodipine.  Ensure to take as prescribed.  It is important that you check your blood pressure at least 2-3 times a week at the same time time.  Make sure to keep a log of your blood pressure so that way you can show your primary care provider in case they need to adjust your dose.  You may follow-up with your primary care provider as needed.  Return to the emergency department for experiencing increasing/worsening headache, vision changes, worsening symptoms. ?

## 2022-06-04 ENCOUNTER — Emergency Department (HOSPITAL_COMMUNITY): Payer: Medicaid Other

## 2022-06-04 ENCOUNTER — Other Ambulatory Visit: Payer: Self-pay

## 2022-06-04 ENCOUNTER — Encounter (HOSPITAL_COMMUNITY): Payer: Self-pay

## 2022-06-04 ENCOUNTER — Emergency Department (HOSPITAL_COMMUNITY)
Admission: EM | Admit: 2022-06-04 | Discharge: 2022-06-04 | Disposition: A | Discharge status: Home / Self Care | Payer: Medicaid Other | Attending: Emergency Medicine | Admitting: Emergency Medicine

## 2022-06-04 DIAGNOSIS — D259 Leiomyoma of uterus, unspecified: Secondary | ICD-10-CM

## 2022-06-04 DIAGNOSIS — R109 Unspecified abdominal pain: Secondary | ICD-10-CM | POA: Diagnosis present

## 2022-06-04 DIAGNOSIS — I10 Essential (primary) hypertension: Secondary | ICD-10-CM | POA: Insufficient documentation

## 2022-06-04 DIAGNOSIS — Z79899 Other long term (current) drug therapy: Secondary | ICD-10-CM | POA: Diagnosis not present

## 2022-06-04 DIAGNOSIS — N9489 Other specified conditions associated with female genital organs and menstrual cycle: Secondary | ICD-10-CM | POA: Insufficient documentation

## 2022-06-04 DIAGNOSIS — M545 Low back pain, unspecified: Secondary | ICD-10-CM

## 2022-06-04 DIAGNOSIS — I700 Atherosclerosis of aorta: Secondary | ICD-10-CM | POA: Diagnosis not present

## 2022-06-04 LAB — LIPASE, BLOOD: Lipase: 45 U/L (ref 11–51)

## 2022-06-04 LAB — URINALYSIS, ROUTINE W REFLEX MICROSCOPIC
Bacteria, UA: NONE SEEN
Bilirubin Urine: NEGATIVE
Glucose, UA: NEGATIVE mg/dL
Ketones, ur: NEGATIVE mg/dL
Leukocytes,Ua: NEGATIVE
Nitrite: NEGATIVE
Protein, ur: NEGATIVE mg/dL
Specific Gravity, Urine: 1.017 (ref 1.005–1.030)
pH: 6 (ref 5.0–8.0)

## 2022-06-04 LAB — CBC WITH DIFFERENTIAL/PLATELET
Abs Immature Granulocytes: 0.04 10*3/uL (ref 0.00–0.07)
Basophils Absolute: 0.1 10*3/uL (ref 0.0–0.1)
Basophils Relative: 1 %
Eosinophils Absolute: 0.3 10*3/uL (ref 0.0–0.5)
Eosinophils Relative: 3 %
HCT: 37.5 % (ref 36.0–46.0)
Hemoglobin: 12.5 g/dL (ref 12.0–15.0)
Immature Granulocytes: 0 %
Lymphocytes Relative: 20 %
Lymphs Abs: 1.9 10*3/uL (ref 0.7–4.0)
MCH: 26.9 pg (ref 26.0–34.0)
MCHC: 33.3 g/dL (ref 30.0–36.0)
MCV: 80.8 fL (ref 80.0–100.0)
Monocytes Absolute: 1 10*3/uL (ref 0.1–1.0)
Monocytes Relative: 10 %
Neutro Abs: 6.4 10*3/uL (ref 1.7–7.7)
Neutrophils Relative %: 66 %
Platelets: 248 10*3/uL (ref 150–400)
RBC: 4.64 MIL/uL (ref 3.87–5.11)
RDW: 13.6 % (ref 11.5–15.5)
WBC: 9.7 10*3/uL (ref 4.0–10.5)
nRBC: 0 % (ref 0.0–0.2)

## 2022-06-04 LAB — I-STAT BETA HCG BLOOD, ED (MC, WL, AP ONLY): I-stat hCG, quantitative: <5 m[IU]/mL (ref <5)

## 2022-06-04 LAB — COMPREHENSIVE METABOLIC PANEL
ALT: 11 U/L (ref 0–44)
AST: 19 U/L (ref 15–41)
Albumin: 3.6 g/dL (ref 3.5–5.0)
Alkaline Phosphatase: 76 U/L (ref 38–126)
Anion gap: 9 (ref 5–15)
BUN: 7 mg/dL (ref 6–20)
CO2: 20 mmol/L — ABNORMAL LOW (ref 22–32)
Calcium: 8.7 mg/dL — ABNORMAL LOW (ref 8.9–10.3)
Chloride: 104 mmol/L (ref 98–111)
Creatinine, Ser: 0.97 mg/dL (ref 0.44–1.00)
GFR, Estimated: >60 mL/min (ref >60)
Glucose, Bld: 161 mg/dL — ABNORMAL HIGH (ref 70–99)
Potassium: 3.2 mmol/L — ABNORMAL LOW (ref 3.5–5.1)
Sodium: 133 mmol/L — ABNORMAL LOW (ref 135–145)
Total Bilirubin: 0.3 mg/dL (ref 0.3–1.2)
Total Protein: 7 g/dL (ref 6.5–8.1)

## 2022-06-04 MED ORDER — CYCLOBENZAPRINE HCL 10 MG PO TABS
10.0000 mg | ORAL_TABLET | Freq: Two times a day (BID) | ORAL | 0 refills | Status: DC | PRN
Start: 2022-06-04 — End: 2022-07-10

## 2022-06-04 MED ORDER — IBUPROFEN 400 MG PO TABS
600.0000 mg | ORAL_TABLET | Freq: Once | ORAL | Status: AC
  Administered 2022-06-04: 600 mg via ORAL
  Filled 2022-06-04: qty 1

## 2022-06-04 NOTE — ED Triage Notes (Signed)
Patient complains of bilateral flank pain x 1 day. Currently on menstrual cycle. Denies discharge, denies dysuria. Alert and oriented

## 2022-06-04 NOTE — ED Provider Notes (Signed)
[facility] EMERGENCY DEPARTMENT Provider Note   CSN: [id number] Arrival date & time: 06/04/22  0820     History  No chief complaint on file.   Kellie Bautista is a 41 y.o. female.  HPI   Medical history including hypertension, cesarean section presents with complaints of left-sided flank tenderness going on since yesterday, came on suddenly, feels pain in her left lower back, will occasionally feel like going to her left lower abdomen, no nausea no vomiting still passing gas having normal bowel movements denies melena hematochezia, states she is having urinary frequency without dysuria hematuria, denies any vaginal discharge, she states she has no vaginal bleeding, patient just finished her menstrual cycle, she is not on birth control, she has had her tubes tied, no history of ovarian torsion ovarian cysts.  She notes that her pain is worsened with movement especially with left leg movements, no paresthesia or weakness moving down her legs no saddle paresthesias no urinary or bowel incontinency's.  Has history of kidney stone, concerned might be that, she has no other complaints.    Home Medications Prior to Admission medications   Medication Sig Start Date End Date Taking? Authorizing Provider  cyclobenzaprine (FLEXERIL) 10 MG tablet Take 1 tablet (10 mg total) by mouth 2 (two) times daily as needed for muscle spasms. 06/04/22  Yes Marcello Fennel, PA-C  nicotine (NICODERM CQ - DOSED IN MG/24 HOURS) 21 mg/24hr patch Place 21 mg onto the skin daily as needed (smoking cessation).   Yes [provider]  amLODipine (NORVASC) 5 MG tablet Take 1 tablet (5 mg total) by mouth daily. Patient not taking: Reported on 06/04/2022 11/25/21 08/11/22  Blue, Soijett A, PA-C      Allergies    Shrimp (diagnostic)    Review of Systems   Review of Systems  Constitutional:  Negative for chills and fever.  Respiratory:  Negative for shortness of breath.   Cardiovascular:   Negative for chest pain.  Gastrointestinal:  Positive for abdominal pain. Negative for nausea and vomiting.  Genitourinary:  Positive for flank pain and frequency. Negative for vaginal bleeding, vaginal discharge and vaginal pain.  Neurological:  Negative for headaches.    Physical Exam Updated Vital Signs BP (!) 174/111   Pulse 90   Temp 98.3 F (36.8 C) (Oral)   Resp 19   SpO2 99%  Physical Exam Vitals and nursing note reviewed.  Constitutional:      General: She is not in acute distress.    Appearance: She is not ill-appearing.  HENT:     Head: Normocephalic and atraumatic.     Nose: No congestion.  Eyes:     Conjunctiva/sclera: Conjunctivae normal.  Cardiovascular:     Rate and Rhythm: Normal rate and regular rhythm.     Pulses: Normal pulses.     Heart sounds: No murmur heard.    No friction rub. No gallop.  Pulmonary:     Effort: No respiratory distress.     Breath sounds: No wheezing, rhonchi or rales.  Abdominal:     Palpations: Abdomen is soft.     Tenderness: There is abdominal tenderness. There is no right CVA tenderness or left CVA tenderness.     Comments: Abdomen nondistended, soft, tenderness noted mainly on the suprapubic region, she has pain on her left flank, no guarding rebound as or peritoneal sign negative Murphy sign or McBurney point no CVA tenderness.  Musculoskeletal:     Comments: Spine was palpated was nontender  to palpation no step-off or deformities noted no overlying skin changes, she had noted tenderness within the musculature surrounding the left iliac crest, pain is focalized reproducible, pain is also worsened with straight leg raise on the left side she has 5-5 strength neurovascular tact in lower extremities bilaterally.  Skin:    General: Skin is warm and dry.  Neurological:     Mental Status: She is alert.  Psychiatric:        Mood and Affect: Mood normal.     ED Results / Procedures / Treatments   Labs (all labs ordered are  listed, but only abnormal results are displayed) Labs Reviewed  COMPREHENSIVE METABOLIC PANEL - Abnormal; Notable for the following components:      Result Value   Sodium 133 (*)    Potassium 3.2 (*)    CO2 20 (*)    Glucose, Bld 161 (*)    Calcium 8.7 (*)    All other components within normal limits  URINALYSIS, ROUTINE W REFLEX MICROSCOPIC - Abnormal; Notable for the following components:   APPearance HAZY (*)    Hgb urine dipstick MODERATE (*)    All other components within normal limits  CBC WITH DIFFERENTIAL/PLATELET  LIPASE, BLOOD  I-STAT BETA HCG BLOOD, ED ([facility], [facility], [facility] ONLY)    EKG None  Radiology CT Renal Stone Study  Result Date: 06/04/2022 CLINICAL DATA:  Bilateral flank pain, stone disease suspected. EXAM: CT ABDOMEN AND PELVIS WITHOUT CONTRAST TECHNIQUE: Multidetector CT imaging of the abdomen and pelvis was performed following the standard protocol without IV contrast. RADIATION DOSE REDUCTION: This exam was performed according to the departmental dose-optimization program which includes automated exposure control, adjustment of the mA and/or kV according to patient size and/or use of iterative reconstruction technique. COMPARISON:  None Available. FINDINGS: Lower chest: Normal Hepatobiliary: Normal Pancreas: Normal Spleen: Normal Adrenals/Urinary Tract: Adrenal glands are normal. Kidneys are normal. No cyst, mass, stone or hydronephrosis. Bladder is normal. Stomach/Bowel: Stomach and small intestine are normal. Normal appendix. No abnormal colon finding. Vascular/Lymphatic: Aortic atherosclerosis. No aneurysm. IVC is normal. No adenopathy. Reproductive: No pelvic adnexal mass. Small leiomyomas of the uterus. Other: No free fluid or air. Musculoskeletal: Negative IMPRESSION: 1. No abnormality seen to explain the clinical presentation. No evidence of urinary tract stone disease or other urinary tract pathology. 2. Aortic atherosclerosis. 3. Small leiomyomas of the uterus.  Electronically Signed   By: Nelson Chimes M.D.   On: 06/04/2022 10:20    Procedures Procedures    Medications Ordered in ED Medications  ibuprofen (ADVIL) tablet 600 mg (has no administration in time range)    ED Course/ Medical Decision Making/ A&P                           Medical Decision Making  This patient presents to the ED for concern of flank pain, this involves an extensive number of treatment options, and is a complaint that carries with it a high risk of complications and morbidity.  The differential diagnosis includes muscular strain, pyelo-, kidney stone, diverticulitis, AAA, dissection    Additional history obtained:  Additional history obtained from N/A External records from outside source obtained and reviewed including N/A   Co morbidities that complicate the patient evaluation  Hypertension  Social Determinants of Health:  No care provider    Lab Tests:  I Ordered, and personally interpreted labs.  The pertinent results include: CBC is unremarkable, CMP shows sodium 133,  potassium 3.2, CO2 of 20, glucose 161, UA is unremarkable, lipase is 45, pregnancy negative    Imaging Studies ordered:  I ordered imaging studies including ct renal I independently visualized and interpreted imaging which showed negative acute findings I agree with the radiologist interpretation   Cardiac Monitoring:  The patient was maintained on a cardiac monitor.  I personally viewed and interpreted the cardiac monitored which showed an underlying rhythm of: N/A   Medicines ordered and prescription drug management:  I ordered medication including N/A I have reviewed the patients home medicines and have made adjustments as needed  Critical Interventions:  N/A   Reevaluation:  Presents with left-sided flank tenderness x1 day, triage obtain lab work imaging which I personally reviewed, unremarkable for acute findings, on my exam she had noted left lower back  tenderness seems consistent with a muscular strain, she did have suprapubic pain, on her CT scan reveals that she has leiomyomas she is just got off her menstrual cycle suspect this may be some of her pain.  She is agreement plan discharge at this time.   Consultations Obtained:  N/A   Test Considered:  N/A    Rule out Suspicion for UTI, pyelo-, kidney stone no EVA tenderness, UA is negative for signs of infection or hematuria CT scan is also negative this.  My suspicion for ovarian torsion ovarian cyst is also low at this time, presentation is atypical of etiology, there is no adnexal mass seen on CT imaging.  I have low suspicion for complicated diverticulitis as she is nontoxic-appearing vital signs reassuring no leukocytosis, again CT imaging is negative for acute findings.  I doubt bowel obstruction, volvulus abdomen is nondistended, she is still passing gas having normal bowel movements.  I have low suspicion for spine equina as she is having no focal deficit present on exam no red flag symptoms.  I have suspicion for AAA or dissection as given presentation is atypical etiology, her back pain is focalized reproducible likely more consistent with a muscular strain.    Dispostion and problem list  After consideration of the diagnostic results and the patients response to treatment, I feel that the patent would benefit from discharge..  Back pain-likely muscular strain, will provide with a muscle relaxer, follow-up with PCP for further evaluation. Leiomyomas-suspect part of the cause of her suprapubic pain, will have her follow-up with OB for further evaluation.            Final Clinical Impression(s) / ED Diagnoses Final diagnoses:  Acute left-sided low back pain without sciatica  Uterine leiomyoma, unspecified location    Rx / DC Orders ED Discharge Orders          Ordered    cyclobenzaprine (FLEXERIL) 10 MG tablet  2 times daily PRN        06/04/22 1336               Aron Baba 06/04/22 1338    Fransico Meadow, MD 06/04/22 [phone]

## 2022-06-04 NOTE — ED Provider Triage Note (Signed)
Emergency Medicine Provider Triage Evaluation Note  Kellie Bautista , a 41 y.o. female  was evaluated in triage.  Pt complains of flank pain that started yesterday morning.  No known injury.  States left flank pain worse than right however, occasionally feels it in the right.  No dysuria or vaginal discharge.  Denies fever and chills.  No nausea, vomiting, or diarrhea.  No history of kidney stones.  Denies chest pain and shortness of breath.  Review of Systems  Positive: Flank pain Negative: fever  Physical Exam  BP (!) 159/104 (BP Location: Right Arm)   Pulse (!) 102   Temp 99.3 F (37.4 C) (Oral)   Resp 18   SpO2 98%  Gen:   Awake, no distress   Resp:  Normal effort  MSK:   Moves extremities without difficulty  Other:  +CVA tenderness  Medical Decision Making  Medically screening exam initiated at 8:42 AM.  Appropriate orders placed.  Kellie Bautista was informed that the remainder of the evaluation will be completed by another provider, this initial triage assessment does not replace that evaluation, and the importance of remaining in the ED until their evaluation is complete.  Labs CT renal study   Kellie Bautista 06/04/22 [id number]

## 2022-06-04 NOTE — Discharge Instructions (Signed)
You have been seen here for back pain, I recommend taking over-the-counter pain medications like ibuprofen and/or Tylenol every 6 as needed.  Please follow dosage and on the back of bottle.  I also recommend applying heat to the area and stretching out the muscles as this will help decrease stiffness and pain.  I have given you information on exercises please follow.  I have given you a muscle relaxer take as prescribed this medication can make you drowsy consume alcohol or operate hemorrhaging while taking this medications.  Leiomyomas-small growth of the lining of your uterus these are benign but sometimes can cause some pain, I suspect this may cause some your suprapubic pain, use over-the-counter pain medication as needed please follow-up with women's health for further evaluation  Come back to the emergency department if you develop chest pain, shortness of breath, severe abdominal pain, uncontrolled nausea, vomiting, diarrhea.

## 2022-07-10 ENCOUNTER — Encounter: Payer: Self-pay | Admitting: Family Medicine

## 2022-07-10 ENCOUNTER — Ambulatory Visit: Payer: Medicaid Other | Admitting: Family Medicine

## 2022-07-10 VITALS — BP 151/98 | HR 88 | Temp 98.1°F | Resp 16 | Wt 194.6 lb

## 2022-07-10 DIAGNOSIS — Z7689 Persons encountering health services in other specified circumstances: Secondary | ICD-10-CM | POA: Diagnosis not present

## 2022-07-10 DIAGNOSIS — M25511 Pain in right shoulder: Secondary | ICD-10-CM | POA: Diagnosis not present

## 2022-07-10 DIAGNOSIS — I10 Essential (primary) hypertension: Secondary | ICD-10-CM | POA: Diagnosis not present

## 2022-07-10 MED ORDER — PREDNISONE 50 MG PO TABS: 50.0000 mg | ORAL_TABLET | Freq: Every day | ORAL | 0 refills | Status: DC

## 2022-07-10 MED ORDER — AMLODIPINE BESYLATE 10 MG PO TABS: 10.0000 mg | ORAL_TABLET | Freq: Every day | ORAL | 1 refills | Status: DC

## 2022-07-12 NOTE — Progress Notes (Signed)
Established Patient Office Visit  Subjective    Patient ID: Kellie Bautista, female    DOB: 27-Aug-1981  Age: 41 y.o. MRN: [id number]  CC:  Chief Complaint  Patient presents with   Establish Care    HPI Kellie Bautista presents to establish care and for review of hypertension. Patient also reports that she has had right shoulder pain with decreased movement. She denies known trauma or injury. Patient is right hand dominant.    Outpatient Encounter Medications as of 07/10/2022  Medication Sig   amLODipine (NORVASC) 10 MG tablet Take 1 tablet (10 mg total) by mouth daily.   amLODipine (NORVASC) 5 MG tablet Take 1 tablet (5 mg total) by mouth daily.   predniSONE (DELTASONE) 50 MG tablet Take 1 tablet (50 mg total) by mouth daily with breakfast.   [DISCONTINUED] cyclobenzaprine (FLEXERIL) 10 MG tablet Take 1 tablet (10 mg total) by mouth 2 (two) times daily as needed for muscle spasms.   [DISCONTINUED] nicotine (NICODERM CQ - DOSED IN MG/24 HOURS) 21 mg/24hr patch Place 21 mg onto the skin daily as needed (smoking cessation).   No facility-administered encounter medications on file as of 07/10/2022.    Past Medical History:  Diagnosis Date   Graves disease    Hypertension     Past Surgical History:  Procedure Laterality Date   CESAREAN SECTION      Family History  Problem Relation Age of Onset   Hypertension Mother    Hypertension Brother    Graves' disease Maternal Grandmother    Diabetes Maternal Grandmother     Social History   Socioeconomic History   Marital status: Married    Spouse name: Not on file   Number of children: Not on file   Years of education: Not on file   Highest education level: Not on file  Occupational History   Not on file  Tobacco Use   Smoking status: Every Day   Smokeless tobacco: Never  Vaping Use   Vaping Use: Never used  Substance and Sexual Activity   Alcohol use: Never   Drug use: Never   Sexual activity: Never  Other Topics  Concern   Not on file  Social History Narrative   Not on file   Social Determinants of Health   Financial Resource Strain: Not on file  Food Insecurity: Not on file  Transportation Needs: Not on file  Physical Activity: Not on file  Stress: Not on file  Social Connections: Not on file  Intimate Partner Violence: Not on file    Review of Systems  All other systems reviewed and are negative.       Objective    BP (!) 151/98   Pulse 88   Temp 98.1 F (36.7 C) (Oral)   Resp 16   Wt 194 lb 9.6 oz (88.3 kg)   SpO2 95%   BMI 33.40 kg/m   Physical Exam Vitals and nursing note reviewed.  Constitutional:      General: She is not in acute distress. Cardiovascular:     Rate and Rhythm: Normal rate and regular rhythm.  Pulmonary:     Effort: Pulmonary effort is normal.     Breath sounds: Normal breath sounds.  Abdominal:     Palpations: Abdomen is soft.     Tenderness: There is no abdominal tenderness.  Musculoskeletal:     Right shoulder: Tenderness present. No swelling, deformity or effusion. Decreased range of motion.  Neurological:     General: No focal  deficit present.     Mental Status: She is alert and oriented to person, place, and time.         Assessment & Plan:   1. Essential hypertension Elevated reading. Will increase amlodipine from '5mg'$  to 10 mg daily.   2. Right shoulder pain, unspecified chronicity Prednisone prescribed. Patient defers further eval at this time.   3. Encounter to establish care     Return in about 3 months (around 10/10/2022) for follow up.   Becky Sax, MD

## 2022-10-11 ENCOUNTER — Encounter: Payer: Medicaid Other | Admitting: Family Medicine

## 2022-12-13 ENCOUNTER — Ambulatory Visit
Admission: EM | Admit: 2022-12-13 | Discharge: 2022-12-13 | Disposition: A | Discharge status: Home / Self Care | Payer: Medicaid Other | Attending: Physician Assistant | Admitting: Physician Assistant

## 2022-12-13 DIAGNOSIS — M62838 Other muscle spasm: Secondary | ICD-10-CM

## 2022-12-13 MED ORDER — METHOCARBAMOL 500 MG PO TABS: 500.0000 mg | ORAL_TABLET | Freq: Four times a day (QID) | ORAL | 0 refills | Status: DC

## 2022-12-13 MED ORDER — DICLOFENAC SODIUM 75 MG PO TBEC: 75.0000 mg | DELAYED_RELEASE_TABLET | Freq: Two times a day (BID) | ORAL | 0 refills | Status: DC

## 2022-12-13 NOTE — ED Triage Notes (Signed)
Pt states right neck pain that radiates to her right shoulder.  Pt denies injury.  Swelling noted over right clavicle.  States she took alleve yesterday and has been using ice with no relief.

## 2022-12-13 NOTE — Discharge Instructions (Addendum)
Return if any problems.

## 2022-12-13 NOTE — ED Provider Notes (Signed)
[facility]    CSN: [id number] Arrival date & time: 12/13/22  1000      History   Chief Complaint Chief Complaint  Patient presents with   Shoulder Pain    HPI Kellie Bautista is a 42 y.o. female.   Pt complains of shoulder discomfort.  Pt reports pain started after cleaning her house and working.  Pt reports neck and shoulder feels tight.  No fall.   The history is provided by the patient. No language interpreter was used.  Shoulder Pain Location:  Shoulder Shoulder location:  R shoulder Pain details:    Radiates to:  Does not radiate   Severity:  Moderate   Timing:  Constant   Progression:  Worsening Handedness:  Right-handed   Past Medical History:  Diagnosis Date   Graves disease    Hypertension     Patient Active Problem List   Diagnosis Date Noted   Goiter 02/02/2016   Graves disease 07/13/2013   Hyperthyroidism 04/21/2013    Past Surgical History:  Procedure Laterality Date   CESAREAN SECTION      OB History   No obstetric history on file.      Home Medications    Prior to Admission medications   Medication Sig Start Date End Date Taking? Authorizing Provider  diclofenac (VOLTAREN) 75 MG EC tablet Take 1 tablet (75 mg total) by mouth 2 (two) times daily. 12/13/22  Yes Cheron Schaumann K, PA-C  methocarbamol (ROBAXIN) 500 MG tablet Take 1 tablet (500 mg total) by mouth 4 (four) times daily. 12/13/22  Yes Cheron Schaumann K, PA-C  amLODipine (NORVASC) 10 MG tablet Take 1 tablet (10 mg total) by mouth daily. 07/10/22   Georganna Skeans, MD  amLODipine (NORVASC) 5 MG tablet Take 1 tablet (5 mg total) by mouth daily. 11/25/21 08/11/22  Blue, Soijett A, PA-C  predniSONE (DELTASONE) 50 MG tablet Take 1 tablet (50 mg total) by mouth daily with breakfast. 07/10/22   Georganna Skeans, MD    Family History Family History  Problem Relation Age of Onset   Hypertension Mother    Hypertension Brother    Graves' disease Maternal Grandmother    Diabetes  Maternal Grandmother     Social History Social History   Tobacco Use   Smoking status: Every Day   Smokeless tobacco: Never  Vaping Use   Vaping Use: Never used  Substance Use Topics   Alcohol use: Never   Drug use: Never     Allergies   Shrimp (diagnostic)   Review of Systems Review of Systems  All other systems reviewed and are negative.    Physical Exam Triage Vital Signs ED Triage Vitals  Enc Vitals Group     BP 12/13/22 1127 (!) 194/92     Pulse Rate 12/13/22 1127 (!) 104     Resp 12/13/22 1127 16     Temp 12/13/22 1127 99.2 F (37.3 C)     Temp Source 12/13/22 1127 Oral     SpO2 12/13/22 1127 97 %     Weight --      Height --      Head Circumference --      Peak Flow --      Pain Score 12/13/22 1128 9     Pain Loc --      Pain Edu? --      Excl. in GC? --    No data found.  Updated Vital Signs BP (!) 194/92 (BP Location: Left Arm)  Pulse (!) 104   Temp 99.2 F (37.3 C) (Oral)   Resp 16   LMP 12/08/2022 (Approximate)   SpO2 97%   Visual Acuity Right Eye Distance:   Left Eye Distance:   Bilateral Distance:    Right Eye Near:   Left Eye Near:    Bilateral Near:     Physical Exam Vitals and nursing note reviewed.  Constitutional:      General: She is not in acute distress.    Appearance: She is well-developed.  HENT:     Head: Normocephalic and atraumatic.  Eyes:     Conjunctiva/sclera: Conjunctivae normal.  Cardiovascular:     Rate and Rhythm: Normal rate.     Heart sounds: No murmur heard. Pulmonary:     Effort: Pulmonary effort is normal. No respiratory distress.  Musculoskeletal:        General: Swelling present.     Comments: Tender shoulder and right trpezius   Skin:    General: Skin is warm and dry.     Capillary Refill: Capillary refill takes less than 2 seconds.  Neurological:     Mental Status: She is alert.  Psychiatric:        Mood and Affect: Mood normal.      UC Treatments / Results  Labs (all labs  ordered are listed, but only abnormal results are displayed) Labs Reviewed - No data to display  EKG   Radiology No results found.  Procedures Procedures (including critical care time)  Medications Ordered in UC Medications - No data to display  Initial Impression / Assessment and Plan / UC Course  I have reviewed the triage vital signs and the nursing notes.  Pertinent labs & imaging results that were available during my care of the patient were reviewed by me and considered in my medical decision making (see chart for details).     An After Visit Summary was printed and given to the patient.     Final Clinical Impressions(s) / UC Diagnoses   Final diagnoses:  Muscle spasm     Discharge Instructions      Return if any problems.    ED Prescriptions     Medication Sig Dispense Auth. Provider   diclofenac (VOLTAREN) 75 MG EC tablet Take 1 tablet (75 mg total) by mouth 2 (two) times daily. 20 tablet Sharnetta Gielow K, [place]   methocarbamol (ROBAXIN) 500 MG tablet Take 1 tablet (500 mg total) by mouth 4 (four) times daily. 20 tablet Elson Areas, [place]      PDMP not reviewed this encounter.   Elson Areas, [place] 12/13/22 1153

## 2023-01-27 ENCOUNTER — Encounter (HOSPITAL_BASED_OUTPATIENT_CLINIC_OR_DEPARTMENT_OTHER): Payer: Self-pay

## 2023-01-27 ENCOUNTER — Emergency Department (HOSPITAL_BASED_OUTPATIENT_CLINIC_OR_DEPARTMENT_OTHER): Payer: Medicaid Other

## 2023-01-27 ENCOUNTER — Other Ambulatory Visit: Payer: Self-pay

## 2023-01-27 ENCOUNTER — Emergency Department (HOSPITAL_BASED_OUTPATIENT_CLINIC_OR_DEPARTMENT_OTHER)
Admission: EM | Admit: 2023-01-27 | Discharge: 2023-01-27 | Disposition: A | Discharge status: Home / Self Care | Payer: Medicaid Other | Attending: Emergency Medicine | Admitting: Emergency Medicine

## 2023-01-27 DIAGNOSIS — R1013 Epigastric pain: Secondary | ICD-10-CM | POA: Insufficient documentation

## 2023-01-27 DIAGNOSIS — R1011 Right upper quadrant pain: Secondary | ICD-10-CM | POA: Diagnosis not present

## 2023-01-27 DIAGNOSIS — R109 Unspecified abdominal pain: Secondary | ICD-10-CM | POA: Diagnosis not present

## 2023-01-27 DIAGNOSIS — I700 Atherosclerosis of aorta: Secondary | ICD-10-CM | POA: Diagnosis not present

## 2023-01-27 DIAGNOSIS — R110 Nausea: Secondary | ICD-10-CM | POA: Diagnosis not present

## 2023-01-27 LAB — CBC
HCT: 39.7 % (ref 36.0–46.0)
Hemoglobin: 13.1 g/dL (ref 12.0–15.0)
MCH: 26.4 pg (ref 26.0–34.0)
MCHC: 33 g/dL (ref 30.0–36.0)
MCV: 80 fL (ref 80.0–100.0)
Platelets: 263 10*3/uL (ref 150–400)
RBC: 4.96 MIL/uL (ref 3.87–5.11)
RDW: 14.6 % (ref 11.5–15.5)
WBC: 8.4 10*3/uL (ref 4.0–10.5)
nRBC: 0 % (ref 0.0–0.2)

## 2023-01-27 LAB — URINALYSIS, ROUTINE W REFLEX MICROSCOPIC
Bacteria, UA: NONE SEEN
Bilirubin Urine: NEGATIVE
Glucose, UA: NEGATIVE mg/dL
Hgb urine dipstick: NEGATIVE
Ketones, ur: NEGATIVE mg/dL
Leukocytes,Ua: NEGATIVE
Nitrite: NEGATIVE
Specific Gravity, Urine: 1.02 (ref 1.005–1.030)
pH: 7 (ref 5.0–8.0)

## 2023-01-27 LAB — COMPREHENSIVE METABOLIC PANEL
ALT: 13 U/L (ref 0–44)
AST: 15 U/L (ref 15–41)
Albumin: 4.4 g/dL (ref 3.5–5.0)
Alkaline Phosphatase: 76 U/L (ref 38–126)
Anion gap: 9 (ref 5–15)
BUN: 7 mg/dL (ref 6–20)
CO2: 26 mmol/L (ref 22–32)
Calcium: 9.5 mg/dL (ref 8.9–10.3)
Chloride: 102 mmol/L (ref 98–111)
Creatinine, Ser: 0.71 mg/dL (ref 0.44–1.00)
GFR, Estimated: >60 mL/min (ref >60)
Glucose, Bld: 111 mg/dL — ABNORMAL HIGH (ref 70–99)
Potassium: 3.6 mmol/L (ref 3.5–5.1)
Sodium: 137 mmol/L (ref 135–145)
Total Bilirubin: 0.4 mg/dL (ref 0.3–1.2)
Total Protein: 7.9 g/dL (ref 6.5–8.1)

## 2023-01-27 LAB — LIPASE, BLOOD: Lipase: 34 U/L (ref 11–51)

## 2023-01-27 LAB — PREGNANCY, URINE: Preg Test, Ur: NEGATIVE

## 2023-01-27 MED ORDER — IOHEXOL 300 MG/ML  SOLN
100.0000 mL | Freq: Once | INTRAMUSCULAR | Status: AC | PRN
  Administered 2023-01-27: 80 mL via INTRAVENOUS

## 2023-01-27 MED ORDER — PANTOPRAZOLE SODIUM 20 MG PO TBEC: 20.0000 mg | DELAYED_RELEASE_TABLET | Freq: Every day | ORAL | 0 refills | Status: DC

## 2023-01-27 MED ORDER — PANTOPRAZOLE SODIUM 40 MG IV SOLR
40.0000 mg | Freq: Once | INTRAVENOUS | Status: AC
  Administered 2023-01-27: 40 mg via INTRAVENOUS
  Filled 2023-01-27: qty 10

## 2023-01-27 MED ORDER — FAMOTIDINE 20 MG PO TABS: 20.0000 mg | ORAL_TABLET | Freq: Two times a day (BID) | ORAL | 0 refills | Status: DC

## 2023-01-27 MED ORDER — ONDANSETRON 4 MG PO TBDP: 4.0000 mg | ORAL_TABLET | Freq: Three times a day (TID) | ORAL | 0 refills | Status: DC | PRN

## 2023-01-27 NOTE — ED Triage Notes (Signed)
She tells me she has has right sided abd. Pain radiating to back which is made worse by eating x 4 days. She tells me that she has htn and Grave's dis.

## 2023-01-27 NOTE — Discharge Instructions (Signed)
Please read and follow all provided instructions.  Your diagnoses today include:  1. Epigastric abdominal pain     Tests performed today include: Blood cell counts and platelets Kidney and liver function tests Pancreas function test (called lipase) Urine test to look for infection A blood or urine test for pregnancy (women only) Ultrasound of your gallbladder was normal without gallstones CT scan showed normal appearing intestines and appendix, you did have some mild blood vessel disease in the aorta Vital signs. See below for your results today.   Medications prescribed:  Pantoprazole (Protonix) - stomach acid reducer  Pepcid (famotidine) - antihistamine  You can find this medication over-the-counter.   DO NOT exceed:  20mg  Pepcid every 12 hours  Zofran (ondansetron) - for nausea and vomiting  Take any prescribed medications only as directed.  Home care instructions:  Follow any educational materials contained in this packet.  Follow-up instructions: Please follow-up with your primary care provider in the next 3-5 days for further evaluation of your symptoms.    Return instructions:  SEEK IMMEDIATE MEDICAL ATTENTION IF: The pain does not go away or becomes severe  A temperature above 101F develops  Repeated vomiting occurs (multiple episodes)  The pain becomes localized to portions of the abdomen. The right side could possibly be appendicitis. In an adult, the left lower portion of the abdomen could be colitis or diverticulitis.  Blood is being passed in stools or vomit (bright red or black tarry stools)  You develop chest pain, difficulty breathing, dizziness or fainting, or become confused, poorly responsive, or inconsolable (young children) If you have any other emergent concerns regarding your health  Additional Information: Abdominal (belly) pain can be caused by many things. Your caregiver performed an examination and possibly ordered blood/urine tests and  imaging (CT scan, x-rays, ultrasound). Many cases can be observed and treated at home after initial evaluation in the emergency department. Even though you are being discharged home, abdominal pain can be unpredictable. Therefore, you need a repeated exam if your pain does not resolve, returns, or worsens. Most patients with abdominal pain don't have to be admitted to the hospital or have surgery, but serious problems like appendicitis and gallbladder attacks can start out as nonspecific pain. Many abdominal conditions cannot be diagnosed in one visit, so follow-up evaluations are very important.  Your vital signs today were: BP (!) 179/117 (BP Location: Right Arm)   Pulse 80   Temp 98.2 F (36.8 C) (Oral)   Resp 16   SpO2 99%  If your blood pressure (bp) was elevated above 135/85 this visit, please have this repeated by your doctor within one month. --------------

## 2023-01-27 NOTE — ED Provider Notes (Signed)
[place] EMERGENCY DEPARTMENT AT [facility] Provider Note   CSN: [id number] Arrival date & time: 01/27/23  7829     History  Chief Complaint  Patient presents with   Abdominal Pain    Kellie Bautista is a 42 y.o. female.  Patient presents emergency department today for evaluation of right upper quadrant abdominal pain.  She denies previous surgical history.  2 days ago symptoms started as pain in her right mid back.  This then moved anteriorly.  Her pain has been waxing waning, but getting progressively worse.  She has has associated nausea without vomiting.  No fevers.  Pain is worse with eating and drinking.  No known history of gallstones.  She denies heavy alcohol use, heavy NSAID use.  She thought that she was constipated because she did not have a bowel movement in about a week and she took a Linzess yesterday for the first time.  No urinary symptoms.       Home Medications Prior to Admission medications   Medication Sig Start Date End Date Taking? Authorizing Provider  amLODipine (NORVASC) 10 MG tablet Take 1 tablet (10 mg total) by mouth daily. 07/10/22   Georganna Skeans, MD  amLODipine (NORVASC) 5 MG tablet Take 1 tablet (5 mg total) by mouth daily. 11/25/21 08/11/22  Blue, Soijett A, PA-C  diclofenac (VOLTAREN) 75 MG EC tablet Take 1 tablet (75 mg total) by mouth 2 (two) times daily. 12/13/22   Elson Areas, PA-C  methocarbamol (ROBAXIN) 500 MG tablet Take 1 tablet (500 mg total) by mouth 4 (four) times daily. 12/13/22   Elson Areas, PA-C  predniSONE (DELTASONE) 50 MG tablet Take 1 tablet (50 mg total) by mouth daily with breakfast. 07/10/22   Georganna Skeans, MD      Allergies    Shrimp (diagnostic)    Review of Systems   Review of Systems  Physical Exam Updated Vital Signs BP (!) 179/117 (BP Location: Right Arm)   Pulse 80   Temp 98.2 F (36.8 C) (Oral)   Resp 16   SpO2 99%  Physical Exam Vitals and nursing note reviewed.  Constitutional:       General: She is not in acute distress.    Appearance: She is well-developed.  HENT:     Head: Normocephalic and atraumatic.     Right Ear: External ear normal.     Left Ear: External ear normal.     Nose: Nose normal.  Eyes:     Conjunctiva/sclera: Conjunctivae normal.  Cardiovascular:     Rate and Rhythm: Normal rate and regular rhythm.     Heart sounds: No murmur heard. Pulmonary:     Effort: No respiratory distress.     Breath sounds: No wheezing, rhonchi or rales.  Abdominal:     Palpations: Abdomen is soft.     Tenderness: There is abdominal tenderness (Moderate) in the right upper quadrant. There is no guarding or rebound. Negative signs include Murphy's sign and McBurney's sign.  Musculoskeletal:     Cervical back: Normal range of motion and neck supple.     Right lower leg: No edema.     Left lower leg: No edema.  Skin:    General: Skin is warm and dry.     Findings: No rash.  Neurological:     General: No focal deficit present.     Mental Status: She is alert. Mental status is at baseline.     Motor: No weakness.  Psychiatric:  Mood and Affect: Mood normal.     ED Results / Procedures / Treatments   Labs (all labs ordered are listed, but only abnormal results are displayed) Labs Reviewed  COMPREHENSIVE METABOLIC PANEL - Abnormal; Notable for the following components:      Result Value   Glucose, Bld 111 (*)    All other components within normal limits  URINALYSIS, ROUTINE W REFLEX MICROSCOPIC - Abnormal; Notable for the following components:   Protein, ur TRACE (*)    All other components within normal limits  LIPASE, BLOOD  CBC  PREGNANCY, URINE    EKG None  Radiology CT ABDOMEN PELVIS W CONTRAST  Result Date: 01/27/2023 CLINICAL DATA:  Right lower quadrant abdominal pain. EXAM: CT ABDOMEN AND PELVIS WITH CONTRAST TECHNIQUE: Multidetector CT imaging of the abdomen and pelvis was performed using the standard protocol following bolus  administration of intravenous contrast. RADIATION DOSE REDUCTION: This exam was performed according to the departmental dose-optimization program which includes automated exposure control, adjustment of the mA and/or kV according to patient size and/or use of iterative reconstruction technique. CONTRAST:  80mL OMNIPAQUE IOHEXOL 300 MG/ML  SOLN COMPARISON:  06/04/2022 FINDINGS: Lower chest: No acute abnormality. Hepatobiliary: No focal liver abnormality is seen. No gallstones, gallbladder wall thickening, or biliary dilatation. Pancreas: Unremarkable. No pancreatic ductal dilatation or surrounding inflammatory changes. Spleen: Normal in size without focal abnormality. Adrenals/Urinary Tract: Normal adrenal glands. No nephrolithiasis, hydronephrosis or suspicious mass. Urinary bladder appears normal. Stomach/Bowel: Stomach is normal. The appendix is visualized and is within normal limits. No pathologic dilatation of the large or small bowel loops. No bowel wall thickening or inflammation. Vascular/Lymphatic: Aortic atherosclerosis. No enlarged abdominal or pelvic lymph nodes. Reproductive: Small fibroids identified.  No adnexal mass. Other: No free fluid or fluid collections identified. No sign of pneumoperitoneum. Musculoskeletal: No acute or significant osseous findings. IMPRESSION: 1. No acute findings within the abdomen or pelvis. The appendix is visualized and is within normal limits. 2. Small uterine fibroids. 3.  Aortic Atherosclerosis (ICD10-I70.0). Electronically Signed   By: Signa Kell M.D.   On: 01/27/2023 11:33   US Abdomen Limited RUQ (LIVER/GB)  Result Date: 01/27/2023 CLINICAL DATA:  Postprandial right upper quadrant pain and nausea for the past 2 days. EXAM: ULTRASOUND ABDOMEN LIMITED RIGHT UPPER QUADRANT COMPARISON:  CT abdomen pelvis dated June 04, 2022. FINDINGS: Gallbladder: No gallstones or wall thickening visualized. No sonographic Murphy sign noted by sonographer. Common bile duct:  Diameter: 2 mm, normal. Liver: No focal lesion identified. Within normal limits in parenchymal echogenicity. Portal vein is patent on color Doppler imaging with normal direction of blood flow towards the liver. Other: None. IMPRESSION: 1. Normal right upper quadrant ultrasound. Electronically Signed   By: Obie Dredge M.D.   On: 01/27/2023 10:14    Procedures Procedures    Medications Ordered in ED Medications - No data to display  ED Course/ Medical Decision Making/ A&P    Patient seen and examined. History obtained directly from patient.   Labs/EKG: Ordered CBC, CMP, lipase, UA, pregnancy.  Imaging: Ordered upper quadrant ultrasound.  Medications/Fluids: Offered medication for pain and nausea, patient currently declines  Most recent vital signs reviewed and are as follows: BP (!) 179/117 (BP Location: Right Arm)   Pulse 80   Temp 98.2 F (36.8 C) (Oral)   Resp 16   SpO2 99%   Initial impression: Right upper quadrant abdominal pain  10:25 AM Reassessment performed. Patient appears stable.  Labs personally reviewed and interpreted including:  CBC, CMP, lipase unremarkable; UA without signs of infection.  Imaging personally visualized and interpreted including: Ultrasound of the gallbladder, agree appears normal without gallstones  Reviewed pertinent lab work and imaging with patient at bedside. Questions answered.  We discussed what to do next.  I told her she has 2 options.  The first is to treat her empirically for gastritis or peptic ulcer disease with acid suppressant medications and close follow-up with PCP next week to see if she is feeling better.  Also gave her the option of additional testing here today with CT imaging of the abdomen pelvis.  Discussed risks and benefits.  After discussion, she elects to proceed with CT imaging today to rule out other problems with her intestines and other causes of right-sided abdominal pain.  CT ordered.  Will give a dose of  Protonix.  Most current vital signs reviewed and are as follows: BP (!) 179/117 (BP Location: Right Arm)   Pulse 80   Temp 98.2 F (36.8 C) (Oral)   Resp 16   SpO2 99%   Plan: Reassess after CT.  11:59 AM Reassessment performed. Patient appears stable, comfortable.   Imaging personally visualized and interpreted including: CT abdomen pelvis, agree negative.  Reviewed pertinent lab work and imaging with patient at bedside. Questions answered. We did chat about the findings of aortic atherosclerosis and importance of controlling blood pressure, blood sugars and cholesterol.  Patient is also a smoker and discussed that this can contribute to blood vessel disease.  Most current vital signs reviewed and are as follows: BP (!) 179/117 (BP Location: Right Arm)   Pulse 80   Temp 98.2 F (36.8 C) (Oral)   Resp 16   SpO2 99%   Plan: Discharge to home.   Prescriptions written for: Protonix, famotidine, Zofran  Other home care instructions discussed: Bland diet, avoidance of alcohol and NSAIDs  ED return instructions discussed: The patient was urged to return to the Emergency Department immediately with worsening of current symptoms, worsening abdominal pain, persistent vomiting, blood noted in stools, fever, or any other concerns. The patient verbalized understanding.   Follow-up instructions discussed: Patient encouraged to follow-up with their PCP in 3-5 days.                             Medical Decision Making Amount and/or Complexity of Data Reviewed Labs: ordered. Radiology: ordered.  Risk Prescription drug management.   For this patient's complaint of abdominal pain, the following conditions were considered on the differential diagnosis: gastritis/PUD, enteritis/duodenitis, appendicitis, cholelithiasis/cholecystitis, cholangitis, pancreatitis, ruptured viscus, colitis, diverticulitis, small/large bowel obstruction, proctitis, cystitis, pyelonephritis, ureteral colic, aortic  dissection, aortic aneurysm. In women, ectopic pregnancy, pelvic inflammatory disease, ovarian cysts, and tubo-ovarian abscess were also considered. Atypical chest etiologies were also considered including ACS, PE, and pneumonia.  Labs reassuring and US/CT are reassuring.  Cannot rule out gastritis, enteritis, peptic ulcer disease based on workup today.  Patient will be started on acid suppression, treated for nausea, and half PCP follow-up.  The patient's vital signs, pertinent lab work and imaging were reviewed and interpreted as discussed in the ED course. Hospitalization was considered for further testing, treatments, or serial exams/observation. However as patient is well-appearing, has a stable exam, and reassuring studies today, I do not feel that they warrant admission at this time. This plan was discussed with the patient who verbalizes agreement and comfort with this plan and seems reliable and able to return  to the Emergency Department with worsening or changing symptoms.          Final Clinical Impression(s) / ED Diagnoses Final diagnoses:  Epigastric abdominal pain    Rx / DC Orders ED Discharge Orders          Ordered    pantoprazole (PROTONIX) 20 MG tablet  Daily        01/27/23 1157    famotidine (PEPCID) 20 MG tablet  2 times daily        01/27/23 1157    ondansetron (ZOFRAN-ODT) 4 MG disintegrating tablet  Every 8 hours PRN        01/27/23 1157              [name], PA-C 01/27/23 1201    Horton, Clabe Seal, DO 01/27/23 1357

## 2023-01-27 NOTE — ED Notes (Signed)
US at bedside

## 2023-02-28 IMAGING — CT CT HEAD W/O CM
4 series · 17 of 47 positions shown, 19 images · non-contrast
Comparison: None.

CLINICAL DATA: Dizziness.  Hypertension

EXAM:
CT HEAD WITHOUT CONTRAST
TECHNIQUE: Contiguous axial images were obtained from the base of the skull
through the vertex without intravenous contrast.

[Series 3: head wo · axial · 0.45mm/px · z∈[+1238,+1358]mm · 7 of 34 slices shown, 9 images]
[im 5/34  brain]
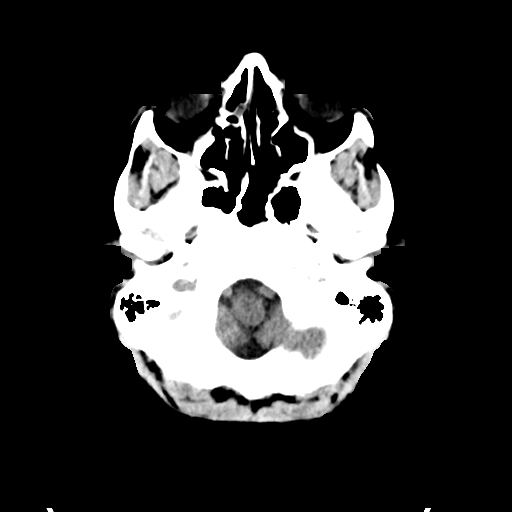
[im 5/34  bone]
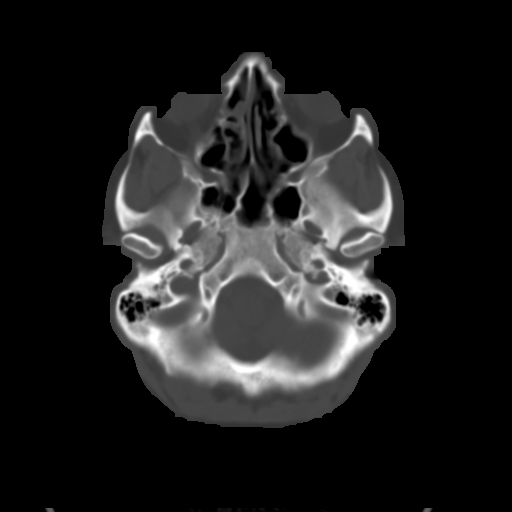
[im 9/34  brain]
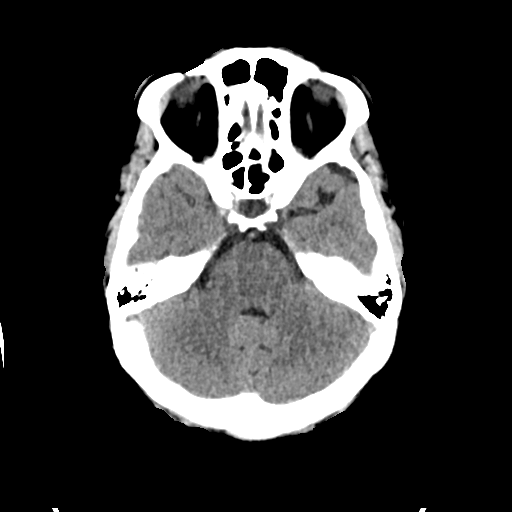
[im 13/34  brain]
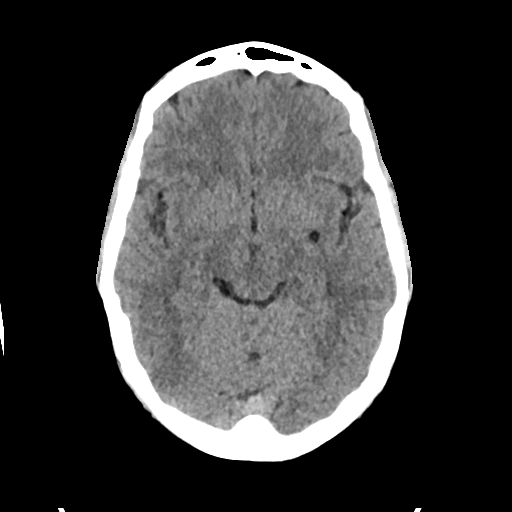
[im 17/34  brain]
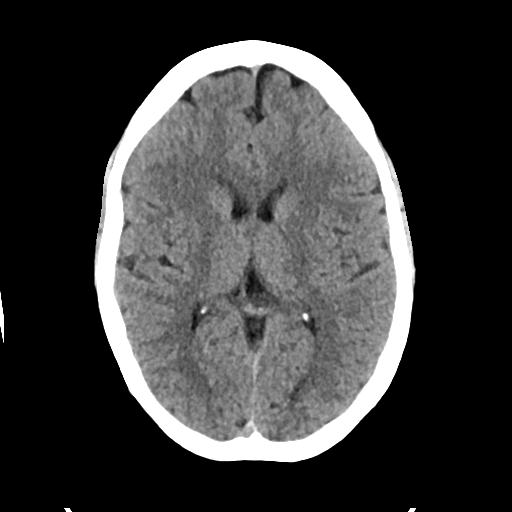
[im 21/34  brain]
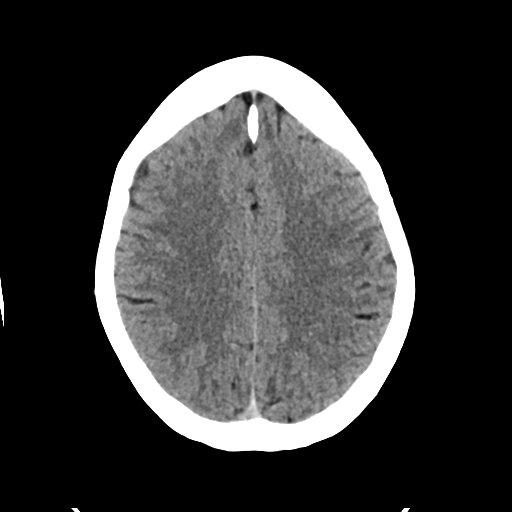
[im 21/34  bone]
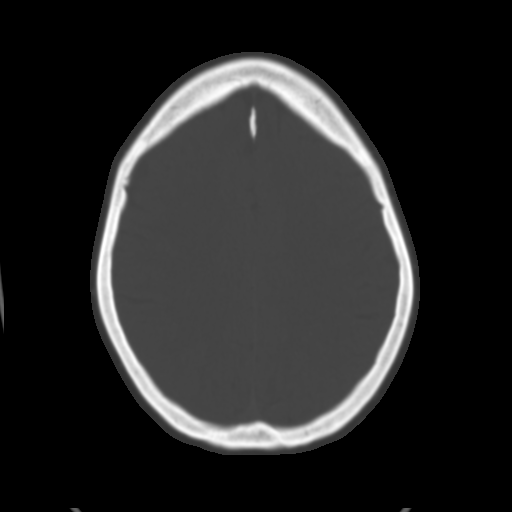
[im 25/34  brain]
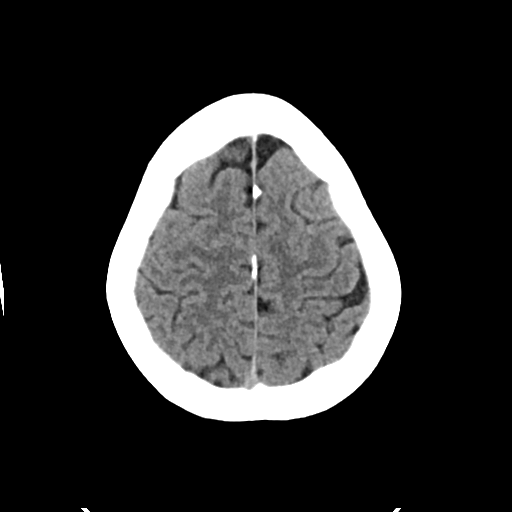
[im 29/34  brain]
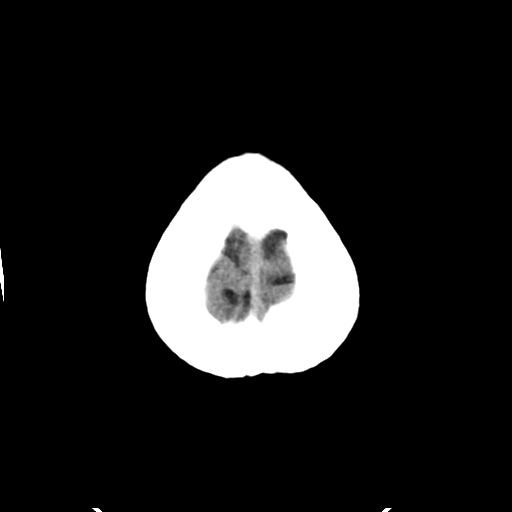

[Series 4: head bone · axial · 0.45mm/px · z∈[+1234,+1292]mm · 4 of 85 slices shown]
[im 9/85  bone]
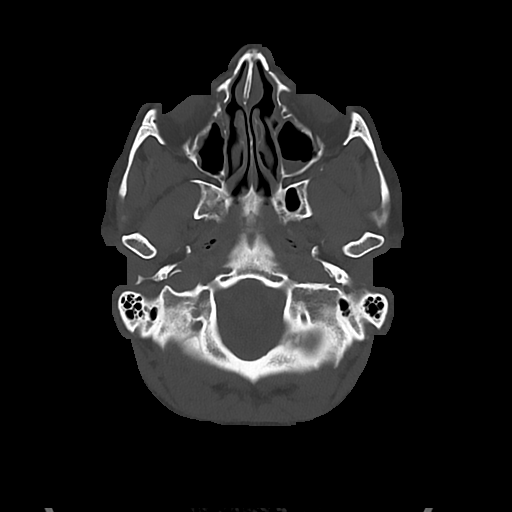
[im 17/85  bone]
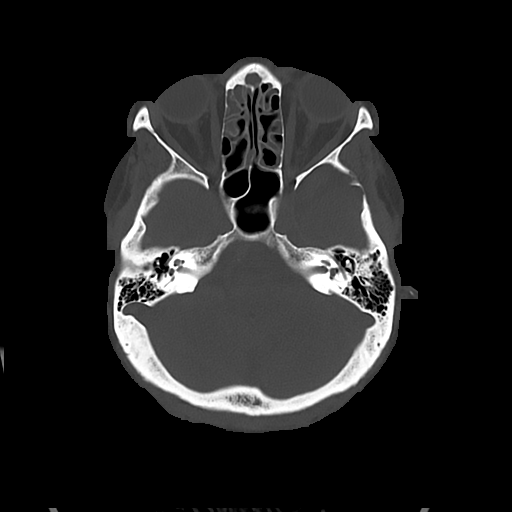
[im 26/85  bone]
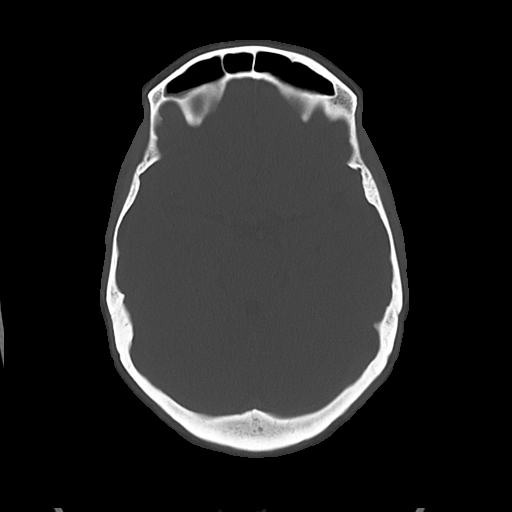
[im 38/85  bone]
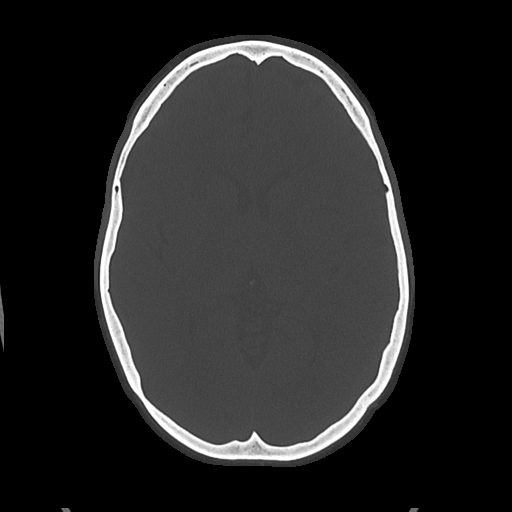

[Series 5: cor soft · coronal · 0.32mm/px · 3 of 70 slices shown]
[im 24/70  brain]
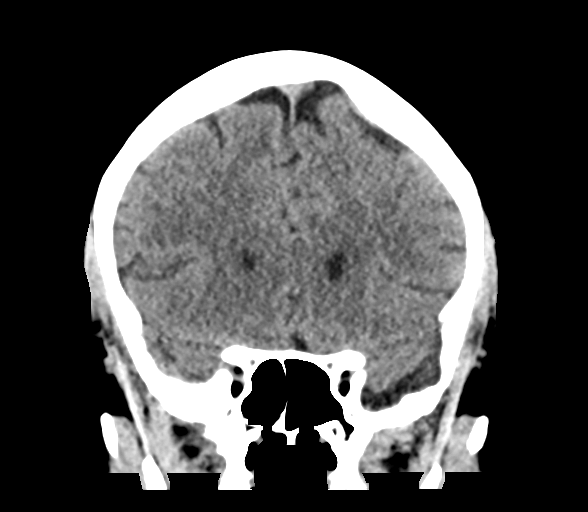
[im 31/70  brain]
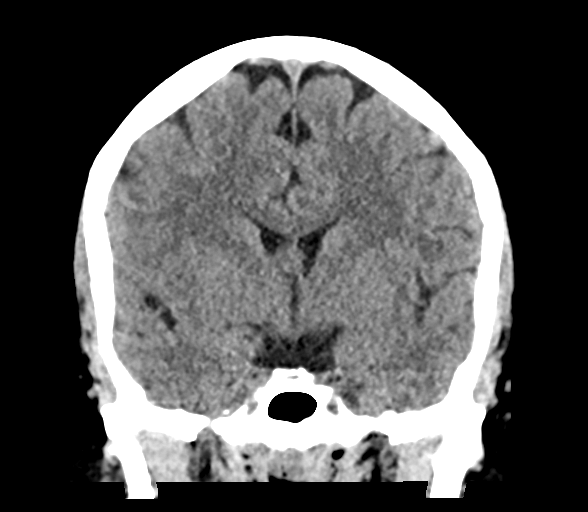
[im 39/70  brain]
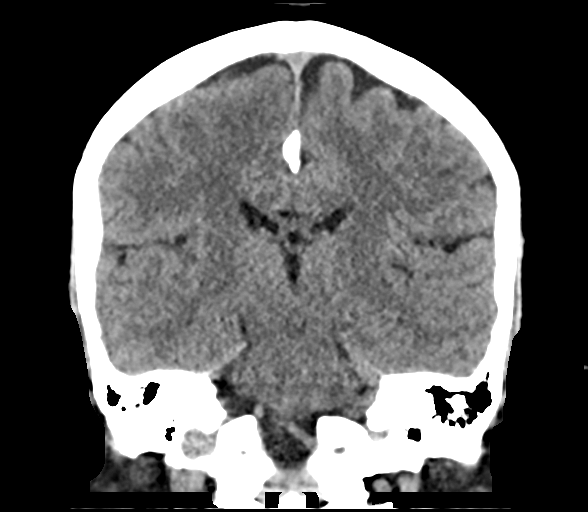

[Series 6: sag soft · sagittal · 0.32mm/px · 3 of 60 slices shown]
[im 20/60  brain]
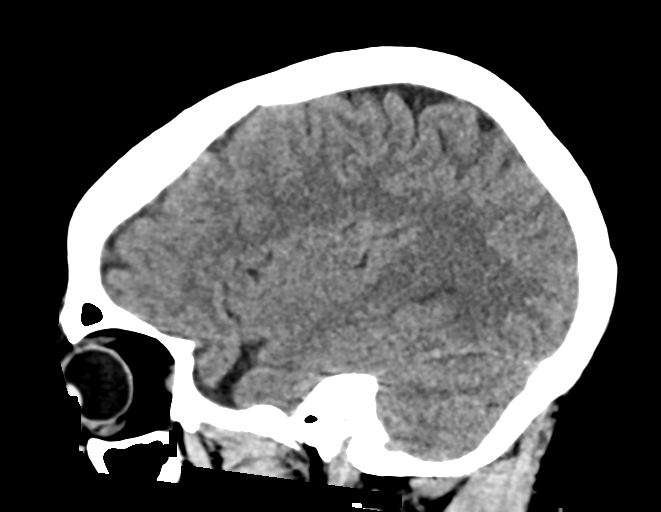
[im 30/60  brain]
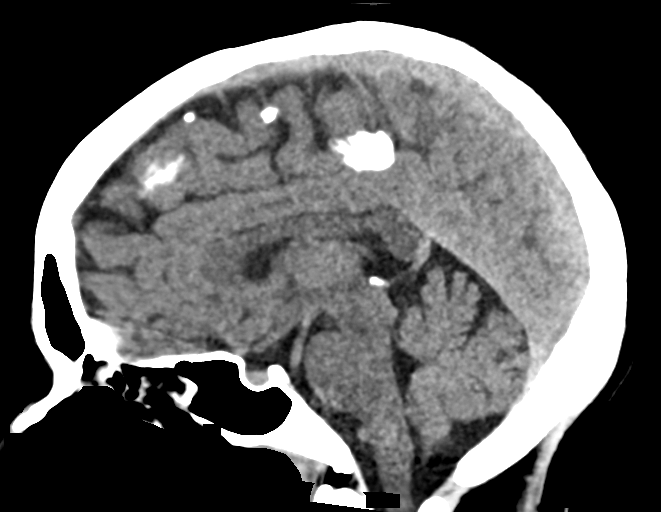
[im 40/60  brain]
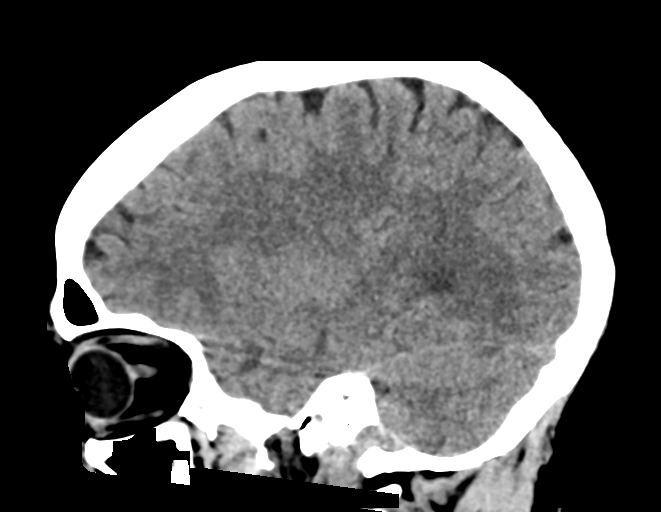

[17 of 47 positions shown; findings below may reference images not displayed]

FINDINGS: Brain: No evidence of acute infarction, hemorrhage, hydrocephalus,
extra-axial collection or mass lesion/mass effect.

Vascular: Negative for hyperdense vessel

Skull: Negative

Sinuses/Orbits: Mucosal edema paranasal sinuses. Air-fluid level
left maxillary sinus. Negative orbit

Other: None
IMPRESSION: Normal CT of the brain

Sinus mucosal disease.

## 2023-06-06 ENCOUNTER — Emergency Department (HOSPITAL_COMMUNITY): Payer: Medicaid Other

## 2023-06-06 ENCOUNTER — Encounter (HOSPITAL_COMMUNITY): Payer: Self-pay | Admitting: Emergency Medicine

## 2023-06-06 ENCOUNTER — Other Ambulatory Visit: Payer: Self-pay

## 2023-06-06 ENCOUNTER — Emergency Department (HOSPITAL_COMMUNITY)
Admission: EM | Admit: 2023-06-06 | Discharge: 2023-06-07 | Disposition: A | Discharge status: Home / Self Care | Payer: Medicaid Other | Attending: Emergency Medicine | Admitting: Emergency Medicine

## 2023-06-06 DIAGNOSIS — I671 Cerebral aneurysm, nonruptured: Secondary | ICD-10-CM | POA: Diagnosis not present

## 2023-06-06 DIAGNOSIS — I10 Essential (primary) hypertension: Secondary | ICD-10-CM | POA: Diagnosis not present

## 2023-06-06 DIAGNOSIS — Z79899 Other long term (current) drug therapy: Secondary | ICD-10-CM | POA: Insufficient documentation

## 2023-06-06 DIAGNOSIS — R42 Dizziness and giddiness: Secondary | ICD-10-CM | POA: Diagnosis not present

## 2023-06-06 DIAGNOSIS — G4489 Other headache syndrome: Secondary | ICD-10-CM | POA: Diagnosis not present

## 2023-06-06 DIAGNOSIS — R519 Headache, unspecified: Secondary | ICD-10-CM | POA: Insufficient documentation

## 2023-06-06 MED ORDER — PROCHLORPERAZINE EDISYLATE 10 MG/2ML IJ SOLN
10.0000 mg | Freq: Once | INTRAMUSCULAR | Status: AC
  Administered 2023-06-06: 10 mg via INTRAVENOUS
  Filled 2023-06-06: qty 2

## 2023-06-06 MED ORDER — DIPHENHYDRAMINE HCL 50 MG/ML IJ SOLN
25.0000 mg | Freq: Once | INTRAMUSCULAR | Status: AC
  Administered 2023-06-06: 25 mg via INTRAVENOUS
  Filled 2023-06-06: qty 1

## 2023-06-06 NOTE — ED Triage Notes (Addendum)
Pt presents from home via EMS for HA Has forgotten BP medications x 1 week. Sudden onset severe HA at 6pm. Took 10mg  amlodipine tonight after HA without any relief. L sided HA. Not on a thinner.  Endorses nausea, blurred vision "like crystals" in both eyes, neck pain, photophobia. 230/148 initial with EMS, decreased to 160/90 en route without intervention from EMS. 98%RA, 98CBG, 80NSR  Denies fever, previous episodes

## 2023-06-06 NOTE — ED Provider Notes (Signed)
[place] EMERGENCY DEPARTMENT AT [facility] Provider Note   CSN: [id number] Arrival date & time: 06/06/23  2021     History  Chief Complaint  Patient presents with   Headache    Kellie Bautista is a 42 y.o. female.  HPI 42 year old female past medical history of high blood pressure presents tonight with headache.  States she has not been taking her blood pressure medication.  She was at work when she had an onset of headache sharp to the left side of her head.  This occurred between 5 and 6 PM.  She went home from work after she got up at 6.  She took 10 mg of Norvasc.  She presents with ongoing headache.  She reports that she she does not normally have headaches.  She denies any numbness, tingling, weakness, vision changes    Home Medications Prior to Admission medications   Medication Sig Start Date End Date Taking? Authorizing Provider  amLODipine (NORVASC) 10 MG tablet Take 1 tablet (10 mg total) by mouth daily. 07/10/22   Georganna Skeans, MD  amLODipine (NORVASC) 5 MG tablet Take 1 tablet (5 mg total) by mouth daily. 11/25/21 08/11/22  Blue, Soijett A, PA-C  diclofenac (VOLTAREN) 75 MG EC tablet Take 1 tablet (75 mg total) by mouth 2 (two) times daily. 12/13/22   Elson Areas, PA-C  famotidine (PEPCID) 20 MG tablet Take 1 tablet (20 mg total) by mouth 2 (two) times daily. 01/27/23   [name], PA-C  methocarbamol (ROBAXIN) 500 MG tablet Take 1 tablet (500 mg total) by mouth 4 (four) times daily. 12/13/22   Elson Areas, PA-C  ondansetron (ZOFRAN-ODT) 4 MG disintegrating tablet Take 1 tablet (4 mg total) by mouth every 8 (eight) hours as needed for nausea or vomiting. 01/27/23   [name], PA-C  pantoprazole (PROTONIX) 20 MG tablet Take 1 tablet (20 mg total) by mouth daily. 01/27/23   [name], PA-C  predniSONE (DELTASONE) 50 MG tablet Take 1 tablet (50 mg total) by mouth daily with breakfast. 07/10/22   Georganna Skeans, MD      Allergies    Shrimp  (diagnostic)    Review of Systems   Review of Systems  Physical Exam Updated Vital Signs BP (!) 160/91   Pulse 71   Temp 98.2 F (36.8 C) (Oral)   Resp 18   Wt 86.2 kg   LMP 05/23/2023 (Approximate)   SpO2 95%   BMI 32.61 kg/m  Physical Exam Vitals reviewed.  HENT:     Head: Normocephalic.  Eyes:     Extraocular Movements: Extraocular movements intact.  Cardiovascular:     Rate and Rhythm: Normal rate and regular rhythm.     Heart sounds: Normal heart sounds.  Pulmonary:     Effort: Pulmonary effort is normal.  Abdominal:     Palpations: Abdomen is soft.  Musculoskeletal:        General: Normal range of motion.     Cervical back: Normal range of motion.  Skin:    General: Skin is warm and dry.  Neurological:     Mental Status: She is alert.  Psychiatric:        Mood and Affect: Mood normal.     ED Results / Procedures / Treatments   Labs (all labs ordered are listed, but only abnormal results are displayed) Labs Reviewed - No data to display  EKG None  Radiology CT Head Wo Contrast  Result Date: 06/06/2023 CLINICAL DATA:  Headache and hypertension. EXAM: CT HEAD WITHOUT CONTRAST TECHNIQUE: Contiguous axial images were obtained from the base of the skull through the vertex without intravenous contrast. RADIATION DOSE REDUCTION: This exam was performed according to the departmental dose-optimization program which includes automated exposure control, adjustment of the mA and/or kV according to patient size and/or use of iterative reconstruction technique. COMPARISON:  November 25, 2021 FINDINGS: Brain: No evidence of acute infarction, hemorrhage, hydrocephalus, extra-axial collection or mass lesion/mass effect. Vascular: No hyperdense vessel or unexpected calcification. Skull: Chronic, bilateral nondisplaced nasal bone fractures are seen. Sinuses/Orbits: There is marked severity bilateral ethmoid sinus mucosal thickening. Other: None. IMPRESSION: 1. No acute  intracranial abnormality. 2. Marked severity bilateral ethmoid sinus disease. Electronically Signed   By: Aram Candela M.D.   On: 06/06/2023 21:08    Procedures Procedures    Medications Ordered in ED Medications  prochlorperazine (COMPAZINE) injection 10 mg (has no administration in time range)  diphenhydrAMINE (BENADRYL) injection 25 mg (has no administration in time range)    ED Course/ Medical Decision Making/ A&P                                 Medical Decision Making Amount and/or Complexity of Data Reviewed Radiology: ordered.  Presents tonight with onset of headache between 5 and 6 PM.  It was fairly severe in nature.  Did follow-up to 8 out of 10 in the first hour.  She is hypertensive.  She does have a history of hypertension has been noncompliant with her medications. She has no focal neurological deficits.  Initial head CT reveals no evidence of acute intracranial abnormality. CT angio is ordered Patient's blood pressure has decreased from 195 systolically to 160 without intervention Differential diagnosis includes but is not limited to migraine headache, tension headaches, headache secondary to infection including meningitis and sinusitis, arachnoid hemorrhage. Patient without significant headache history to suggest migraines, no leukocytosis or fever, neck is supple, doubt infection  Care discussed with Dr. Adela Lank who accepts and sign off and reevaluate after CT angio is pending.         Final Clinical Impression(s) / ED Diagnoses Final diagnoses:  Acute nonintractable headache, unspecified headache type    Rx / DC Orders ED Discharge Orders     None         Margarita Grizzle, MD 06/06/23 2330

## 2023-06-06 NOTE — ED Provider Notes (Signed)
Received patient in turnover from Dr. Rosalia Hammers.  Please see their note for further details of Hx, PE.  Briefly patient is a 42 y.o. female with a Headache .  Patient with a sudden onset headache.  Had CT imaging less than 6 hours after onset that was negative for intracranial hemorrhage.  Plan for CT angiogram and reassessment.  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  Patient headache improved and feeling a bit better.  No obvious neuro deficit, will discuss with neurosurgery.  I discussed case with Dr. Jake Samples.  He thought based on the imaging it would be unlikely that the patient was having bleeding from the aneurysm but did suggest that the definitive workup for this now would be a lumbar puncture.  I discussed this with the patient and family.  Currently electing to go home and will follow-up with her family doctor in the office.    Melene Plan, DO 06/07/23 [id number]

## 2023-06-06 NOTE — ED Provider Triage Note (Signed)
Emergency Medicine Provider Triage Evaluation Note  Kellie Bautista , a 42 y.o. female  was evaluated in triage.  Pt complains of acute onset, thunderclap type headache around 6 PM.  Patient has history of hypertension.  Headache is severe with some radiation into her neck.  She had some nausea at home without vomiting.  Reports blurry vision.  She has not had similar headaches in the past.  She did take amlodipine at home but this did not help.  Review of Systems  Positive: Headache, neck pain Negative: Fever  Physical Exam  BP (!) 195/109 (BP Location: Right Arm)   Pulse 72   Temp 98.2 F (36.8 C) (Oral)   Resp 18   Wt 86.2 kg   LMP 05/23/2023 (Approximate)   SpO2 100%   BMI 32.61 kg/m  Gen:   Appears uncomfortable Resp:  Normal effort  MSK:   Moves extremities without difficulty  Other:    Medical Decision Making  Medically screening exam initiated at 8:43 PM.  Appropriate orders placed.  Kellie Bautista was informed that the remainder of the evaluation will be completed by another provider, this initial triage assessment does not replace that evaluation, and the importance of remaining in the ED until their evaluation is complete.  Blood pressure markedly elevated with severe, acute onset of headache.  Code medical ordered.  Patient brought to CT.  Nursing was able to find a room for patient after CT, provider aware.   Kellie Crigler, PA-C 06/06/23 2044

## 2023-06-07 ENCOUNTER — Emergency Department (HOSPITAL_COMMUNITY): Payer: Medicaid Other

## 2023-06-07 DIAGNOSIS — I671 Cerebral aneurysm, nonruptured: Secondary | ICD-10-CM | POA: Diagnosis not present

## 2023-06-07 LAB — I-STAT CHEM 8, ED
BUN: 6 mg/dL (ref 6–20)
Calcium, Ion: 1.16 mmol/L (ref 1.15–1.40)
Chloride: 103 mmol/L (ref 98–111)
Creatinine, Ser: 0.7 mg/dL (ref 0.44–1.00)
Glucose, Bld: 103 mg/dL — ABNORMAL HIGH (ref 70–99)
HCT: 41 % (ref 36.0–46.0)
Hemoglobin: 13.9 g/dL (ref 12.0–15.0)
Potassium: 3.3 mmol/L — ABNORMAL LOW (ref 3.5–5.1)
Sodium: 140 mmol/L (ref 135–145)
TCO2: 24 mmol/L (ref 22–32)

## 2023-06-07 MED ORDER — IOHEXOL 350 MG/ML SOLN
75.0000 mL | Freq: Once | INTRAVENOUS | Status: AC | PRN
  Administered 2023-06-07: 75 mL via INTRAVENOUS

## 2023-06-07 MED ORDER — DEXAMETHASONE 4 MG PO TABS
10.0000 mg | ORAL_TABLET | Freq: Once | ORAL | Status: AC
  Administered 2023-06-07: 10 mg via ORAL
  Filled 2023-06-07: qty 3

## 2023-06-07 NOTE — Discharge Instructions (Addendum)
Your CT and did not show any active bleeding but there were signs that you have an aneurysm.  To work this up further typically we would do a lumbar puncture.  Please follow-up with your family doctor in the office.  Please return if you would like to have this evaluated or please return if you have sudden worsening of your headache, one-sided numbness or weakness or difficulty with speech or swallowing.

## 2023-06-13 ENCOUNTER — Telehealth: Payer: Self-pay | Admitting: Family Medicine

## 2023-06-13 NOTE — Telephone Encounter (Signed)
Copied from CRM [id number]. Topic: Appointment Scheduling - Scheduling Inquiry for Clinic >> Jun 12, 2023 11:42 AM Marlow Baars wrote: Reason for CRM: The patient called in stating she was seen at [facility] on 10/04 and was found to have 2 aneurysms. They want her to follow up with her primary as soon as possible and to discuss a referral. Please assist patient further

## 2023-06-13 NOTE — Telephone Encounter (Signed)
Called pt and left vm to call office to schedule appt. Pt is due for a HFU appt.

## 2023-06-17 ENCOUNTER — Encounter: Payer: Self-pay | Admitting: Family Medicine

## 2023-06-17 ENCOUNTER — Ambulatory Visit (INDEPENDENT_AMBULATORY_CARE_PROVIDER_SITE_OTHER): Payer: Medicaid Other | Admitting: Family Medicine

## 2023-06-17 VITALS — BP 157/100 | HR 95 | Temp 98.4°F | Resp 16 | Ht 64.0 in | Wt 193.4 lb

## 2023-06-17 DIAGNOSIS — F17200 Nicotine dependence, unspecified, uncomplicated: Secondary | ICD-10-CM | POA: Diagnosis not present

## 2023-06-17 DIAGNOSIS — IMO0001 Reserved for inherently not codable concepts without codable children: Secondary | ICD-10-CM

## 2023-06-17 DIAGNOSIS — I10 Essential (primary) hypertension: Secondary | ICD-10-CM | POA: Diagnosis not present

## 2023-06-17 DIAGNOSIS — R519 Headache, unspecified: Secondary | ICD-10-CM

## 2023-06-17 DIAGNOSIS — K219 Gastro-esophageal reflux disease without esophagitis: Secondary | ICD-10-CM | POA: Diagnosis not present

## 2023-06-17 DIAGNOSIS — I671 Cerebral aneurysm, nonruptured: Secondary | ICD-10-CM | POA: Diagnosis not present

## 2023-06-17 MED ORDER — PANTOPRAZOLE SODIUM 20 MG PO TBEC: 20.0000 mg | DELAYED_RELEASE_TABLET | Freq: Every day | ORAL | 1 refills | Status: DC

## 2023-06-17 MED ORDER — HYDROCHLOROTHIAZIDE 25 MG PO TABS
25.0000 mg | ORAL_TABLET | Freq: Every day | ORAL | 0 refills | Status: DC
Start: 2023-06-17 — End: 2023-07-16

## 2023-06-17 MED ORDER — NICOTINE 14 MG/24HR TD PT24: 14.0000 mg | MEDICATED_PATCH | Freq: Every day | TRANSDERMAL | 1 refills | Status: DC

## 2023-06-19 ENCOUNTER — Encounter: Payer: Self-pay | Admitting: Family Medicine

## 2023-06-19 NOTE — Progress Notes (Signed)
Established Patient Office Visit  Subjective    Patient ID: Kellie Bautista, female    DOB: 1981-05-24  Age: 42 y.o. MRN: [id number]  CC:  Chief Complaint  Patient presents with   Follow-up    Follow up from hospital ,patient needs heart burn medication    HPI Kellie Bautista presents for review of chronic med issues. Patient was recently seen in ED and CVA ruled out. Patient with cerebral aneurysms and onset of headaches that have persisted.   Outpatient Encounter Medications as of 06/17/2023  Medication Sig   amLODipine (NORVASC) 10 MG tablet Take 1 tablet (10 mg total) by mouth daily.   hydrochlorothiazide (HYDRODIURIL) 25 MG tablet Take 1 tablet (25 mg total) by mouth daily.   nicotine (NICODERM CQ) 14 mg/24hr patch Place 1 patch (14 mg total) onto the skin daily.   famotidine (PEPCID) 20 MG tablet Take 1 tablet (20 mg total) by mouth 2 (two) times daily. (Patient not taking: Reported on 06/17/2023)   methocarbamol (ROBAXIN) 500 MG tablet Take 1 tablet (500 mg total) by mouth 4 (four) times daily. (Patient not taking: Reported on 06/17/2023)   ondansetron (ZOFRAN-ODT) 4 MG disintegrating tablet Take 1 tablet (4 mg total) by mouth every 8 (eight) hours as needed for nausea or vomiting. (Patient not taking: Reported on 06/17/2023)   pantoprazole (PROTONIX) 20 MG tablet Take 1 tablet (20 mg total) by mouth daily.   [DISCONTINUED] amLODipine (NORVASC) 5 MG tablet Take 1 tablet (5 mg total) by mouth daily.   [DISCONTINUED] diclofenac (VOLTAREN) 75 MG EC tablet Take 1 tablet (75 mg total) by mouth 2 (two) times daily.   [DISCONTINUED] pantoprazole (PROTONIX) 20 MG tablet Take 1 tablet (20 mg total) by mouth daily.   [DISCONTINUED] predniSONE (DELTASONE) 50 MG tablet Take 1 tablet (50 mg total) by mouth daily with breakfast.   No facility-administered encounter medications on file as of 06/17/2023.    Past Medical History:  Diagnosis Date   Graves disease    Hypertension     Past  Surgical History:  Procedure Laterality Date   CESAREAN SECTION      Family History  Problem Relation Age of Onset   Hypertension Mother    Hypertension Brother    Graves' disease Maternal Grandmother    Diabetes Maternal Grandmother     Social History   Socioeconomic History   Marital status: Married    Spouse name: Not on file   Number of children: Not on file   Years of education: Not on file   Highest education level: Not on file  Occupational History   Not on file  Tobacco Use   Smoking status: Every Day   Smokeless tobacco: Never  Vaping Use   Vaping status: Never Used  Substance and Sexual Activity   Alcohol use: Never   Drug use: Never   Sexual activity: Never  Other Topics Concern   Not on file  Social History Narrative   Not on file   Social Determinants of Health   Financial Resource Strain: Low Risk  (06/17/2023)   Overall Financial Resource Strain (CARDIA)    Difficulty of Paying Living Expenses: Not hard at all  Food Insecurity: No Food Insecurity (06/17/2023)   Hunger Vital Sign    Worried About Running Out of Food in the Last Year: Never true    Ran Out of Food in the Last Year: Never true  Transportation Needs: No Transportation Needs (06/17/2023)   PRAPARE - Transportation  Lack of Transportation (Medical): No    Lack of Transportation (Non-Medical): No  Physical Activity: Sufficiently Active (06/17/2023)   Exercise Vital Sign    Days of Exercise per Week: 5 days    Minutes of Exercise per Session: 30 min  Stress: No Stress Concern Present (06/17/2023)   [employer] of Occupational Health - Occupational Stress Questionnaire    Feeling of Stress : Only a little  Social Connections: Moderately Integrated (06/17/2023)   Social Connection and Isolation Panel [NHANES]    Frequency of Communication with Friends and Family: More than three times a week    Frequency of Social Gatherings with Friends and Family: More than three times a week     Attends Religious Services: More than 4 times per year    Active Member of [employer] or Organizations: No    Attends Banker Meetings: Never    Marital Status: Married  Catering manager Violence: Not At Risk (06/17/2023)   Humiliation, Afraid, Rape, and Kick questionnaire    Fear of Current or Ex-Partner: No    Emotionally Abused: No    Physically Abused: No    Sexually Abused: No    Review of Systems  Neurological:  Positive for headaches.  All other systems reviewed and are negative.       Objective    BP (!) 157/100 (BP Location: Right Arm, Patient Position: Sitting, Cuff Size: Large)   Pulse 95   Temp 98.4 F (36.9 C) (Oral)   Resp 16   Ht 5\' 4"  (1.626 m)   Wt 193 lb 6.4 oz (87.7 kg)   LMP 05/23/2023 (Approximate)   SpO2 96%   BMI 33.20 kg/m   Physical Exam Vitals and nursing note reviewed.  Constitutional:      General: She is not in acute distress. HENT:     Head: Normocephalic and atraumatic.  Cardiovascular:     Rate and Rhythm: Normal rate and regular rhythm.  Pulmonary:     Effort: Pulmonary effort is normal.     Breath sounds: Normal breath sounds.  Abdominal:     Tenderness: There is no abdominal tenderness.  Neurological:     General: No focal deficit present.     Mental Status: She is alert and oriented to person, place, and time.     Motor: No weakness.         Assessment & Plan:   1. Essential hypertension Elevated readings. Hydrochlorothiazide added to regimen  2. Nonintractable episodic headache, unspecified headache type Referral to neuro for further eval/mgt - Ambulatory referral to Neurology  3. Aneurysm, cerebral, nonruptured As above. Discussed keeping blood pressure at goal.   4. Gastroesophageal reflux disease without esophagitis Pantoprazole refilled.   5. Smoking Discussed reduction/cessation - patches prescribed.   Return in about 4 weeks (around 07/15/2023).   Tommie Raymond, MD

## 2023-06-26 ENCOUNTER — Ambulatory Visit (INDEPENDENT_AMBULATORY_CARE_PROVIDER_SITE_OTHER): Payer: Medicaid Other | Admitting: Neurology

## 2023-06-26 ENCOUNTER — Encounter: Payer: Self-pay | Admitting: Neurology

## 2023-06-26 VITALS — BP 134/84 | HR 106 | Ht 64.0 in | Wt 194.0 lb

## 2023-06-26 DIAGNOSIS — R351 Nocturia: Secondary | ICD-10-CM

## 2023-06-26 DIAGNOSIS — R519 Headache, unspecified: Secondary | ICD-10-CM | POA: Diagnosis not present

## 2023-06-26 DIAGNOSIS — Z9189 Other specified personal risk factors, not elsewhere classified: Secondary | ICD-10-CM

## 2023-06-26 DIAGNOSIS — E66811 Obesity, class 1: Secondary | ICD-10-CM

## 2023-06-26 DIAGNOSIS — R0681 Apnea, not elsewhere classified: Secondary | ICD-10-CM

## 2023-06-26 DIAGNOSIS — I671 Cerebral aneurysm, nonruptured: Secondary | ICD-10-CM | POA: Diagnosis not present

## 2023-06-26 NOTE — Progress Notes (Signed)
Subjective:    Patient ID: Kellie Bautista is a 42 y.o. female.  HPI    Huston Foley, MD, PhD [facility] [address], [address] [address] [place], [place] [address]  Dr. Andrey Campanile,  I saw your patient, Kellie Bautista, upon your kind request in my neurologic clinic today for initial consultation of her headache.  The patient is unaccompanied today.  As you know, Ms. Konarski is a 42 year old female with an underlying medical history of Graves' disease, hypertension, reflux disease, smoking and obesity, who reports recent headaches in the context of blood pressure elevation.  She reports that her headache was left-sided when she went to the emergency room recently.  It felt sharp and was associated with light sensitivity and nausea.  She currently is without headache.  She does not typically have migraine headaches in the past.  She has had occasional milder morning headaches, sometimes takes Excedrin for these.  She does not sleep well, she snores and has witnessed apneas.  She has never had a sleep study.  She lives with her husband and 2 of her 67 children, 86 year old daughter and 76 year old son.  She works as a Engineer, site at [employer].  Her snoring is loud and bothersome to her spouse.  Her Epworth sleepiness score is 8 out of 24, fatigue severity score is 58 out of 63.  Her blood pressure has improved since starting hydrochlorothiazide.  She denies any sudden onset of one-sided weakness or numbness or tingling or droopy face or slurring of speech.  She recalls that she had a eyelid droopiness on the left when she went to the emergency room but no facial weakness.  She has not been referred to neurosurgery yet.   She had her last eye exam over 2 years ago, she lost her prescription eyeglasses when she went to the beach, she uses readers.  I reviewed your office note from 06/17/2023.  She presented to the emergency room on 06/06/2023 with a headache.  I reviewed the  emergency room records.  She was treated symptomatically with prochlorperazine and diphenhydramine.  Her blood pressure was elevated at 160/91.  Her blood pressure in your office was highly elevated at 157/100.  Hydrochlorothiazide was added to her medication regimen.  She had a head CT without contrast on 06/06/2023 and I have reviewed the results: Impression: No acute intracranial abnormality.  Marked severity bilateral ethmoid sinus disease.  She had a CT angiogram of the head with and without contrast on 06/07/2023 and I reviewed the results:  Impression: 1. 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 2. Questionable 2 mm aneurysm at the left P-comm origin site. She was not seen by neurosurgery.  Her Epworth sleepiness score is 8 out of 24, fatigue severity score is 58 out of 63. She tries to hydrate well with water.  She drinks caffeine in the form of coffee, about 16 ounces in the morning, she has cut back on sweet tea.  She drinks alcohol very rarely, she smokes about 5 cigarettes/day.  Her Past Medical History Is Significant For: Past Medical History:  Diagnosis Date   Graves disease    Hypertension     Her Past Surgical History Is Significant For: Past Surgical History:  Procedure Laterality Date   CESAREAN SECTION      Her Family History Is Significant For: Family History  Problem Relation Age of Onset   Hypertension Mother    Hypertension  Brother    Graves' disease Maternal Grandmother    Diabetes Maternal Grandmother     Her Social History Is Significant For: Social History   Socioeconomic History   Marital status: Married    Spouse name: Not on file   Number of children: Not on file   Years of education: Not on file   Highest education level: Not on file  Occupational History   Not on file  Tobacco Use   Smoking status: Every Day    Current packs/day: 0.25    Types: Cigarettes   Smokeless tobacco: Never  Vaping Use    Vaping status: Never Used  Substance and Sexual Activity   Alcohol use: Yes    Comment: rare   Drug use: Never   Sexual activity: Never  Other Topics Concern   Not on file  Social History Narrative   Caffiene 1 cip coffee dailly   Work: [employer], Office manager. Works days.   Social Determinants of Health   Financial Resource Strain: Low Risk  (06/17/2023)   Overall Financial Resource Strain (CARDIA)    Difficulty of Paying Living Expenses: Not hard at all  Food Insecurity: No Food Insecurity (06/17/2023)   Hunger Vital Sign    Worried About Running Out of Food in the Last Year: Never true    Ran Out of Food in the Last Year: Never true  Transportation Needs: No Transportation Needs (06/17/2023)   PRAPARE - Administrator, Civil Service (Medical): No    Lack of Transportation (Non-Medical): No  Physical Activity: Sufficiently Active (06/17/2023)   Exercise Vital Sign    Days of Exercise per Week: 5 days    Minutes of Exercise per Session: 30 min  Stress: No Stress Concern Present (06/17/2023)   [employer] of Occupational Health - Occupational Stress Questionnaire    Feeling of Stress : Only a little  Social Connections: Moderately Integrated (06/17/2023)   Social Connection and Isolation Panel [NHANES]    Frequency of Communication with Friends and Family: More than three times a week    Frequency of Social Gatherings with Friends and Family: More than three times a week    Attends Religious Services: More than 4 times per year    Active Member of [employer] or Organizations: No    Attends Banker Meetings: Never    Marital Status: Married    Her Allergies Are:  Allergies  Allergen Reactions   Shrimp (Diagnostic) Nausea And Vomiting  :   Her Current Medications Are:  Outpatient Encounter Medications as of 06/26/2023  Medication Sig   amLODipine (NORVASC) 10 MG tablet Take 1 tablet (10 mg total) by mouth daily.   famotidine (PEPCID) 20 MG  tablet Take 1 tablet (20 mg total) by mouth 2 (two) times daily.   hydrochlorothiazide (HYDRODIURIL) 25 MG tablet Take 1 tablet (25 mg total) by mouth daily.   pantoprazole (PROTONIX) 20 MG tablet Take 1 tablet (20 mg total) by mouth daily.   nicotine (NICODERM CQ) 14 mg/24hr patch Place 1 patch (14 mg total) onto the skin daily. (Patient not taking: Reported on 06/26/2023)   [DISCONTINUED] methocarbamol (ROBAXIN) 500 MG tablet Take 1 tablet (500 mg total) by mouth 4 (four) times daily. (Patient not taking: Reported on 06/17/2023)   [DISCONTINUED] ondansetron (ZOFRAN-ODT) 4 MG disintegrating tablet Take 1 tablet (4 mg total) by mouth every 8 (eight) hours as needed for nausea or vomiting. (Patient not taking: Reported on 06/17/2023)   No facility-administered  encounter medications on file as of 06/26/2023.  :   Review of Systems:  Out of a complete 14 point review of systems, all are reviewed and negative with the exception of these symptoms as listed below:   Review of Systems  Neurological:        Headache, seen ED 06/07/2023.  She did have hypertensive crisis.  Snoring, witness apnea.  No prior SS/ of consult.  ESS 8 FSS 58.    Objective:  Neurological Exam  Physical Exam Physical Examination:   Vitals:   06/26/23 0940  BP: 134/84  Pulse: (!) 106  SpO2: 97%    General Examination: The patient is a very pleasant 42 y.o. female in no acute distress. She appears well-developed and well-nourished and well groomed.   HEENT: Normocephalic, atraumatic, pupils are equal, round and reactive to light, no photophobia, funduscopic exam benign.  Extraocular tracking is good without limitation to gaze excursion or nystagmus noted. Hearing is grossly intact. Face is symmetric with normal facial animation and normal facial sensation to light touch, temperature and vibration sense. Speech is clear with no dysarthria noted. There is no hypophonia. There is no lip, neck/head, jaw or voice tremor.  Neck is supple with full range of passive and active motion. There are no carotid bruits on auscultation. Oropharynx exam reveals: mild mouth dryness, good dental hygiene and moderate airway crowding, due to small airway entry, slightly wider uvula, tonsillar size of about 1+, Mallampati class II.  Neck circumference 15 inches.  Dentures on top.  Minimal overbite noted.  Tongue protrudes centrally and palate elevates symmetrically.  Chest: Clear to auscultation without wheezing, rhonchi or crackles noted.  Heart: S1+S2+0, regular and normal without murmurs, rubs or gallops noted.   Abdomen: Soft, non-tender and non-distended.  Extremities: There is no pitting edema in the distal lower extremities bilaterally.   Skin: Warm and dry without trophic changes noted.   Musculoskeletal: exam reveals no obvious joint deformities.   Neurologically:  Mental status: The patient is awake, alert and oriented in all 4 spheres. Her immediate and remote memory, attention, language skills and fund of knowledge are appropriate. There is no evidence of aphasia, agnosia, apraxia or anomia. Speech is clear with normal prosody and enunciation. Thought process is linear. Mood is normal and affect is normal.  Cranial nerves II - XII are as described above under HEENT exam.  Motor exam: Normal bulk, strength and tone is noted. There is no drift or rebound, no postural or action tremor, no intention tremor, no resting tremor.  Reflexes are 1+ throughout, toes are downgoing bilaterally.    Fine motor skills and coordination: intact finger taps, hand movements and rapid alternating popping in both upper extremities, normal foot taps bilaterally in the lower extremities. Romberg is negative. Cerebellar testing: No dysmetria or intention tremor. There is no truncal or gait ataxia.  Normal finger-to-nose, normal heel-to-shin bilaterally. Sensory exam: intact to light touch, temperature and vibration sense in the upper and  lower extremities.  Gait, station and balance: She stands easily. No veering to one side is noted. No leaning to one side is noted. Posture is age-appropriate and stance is narrow based. Gait shows normal stride length and normal pace. No problems turning are noted.  Tandem walk unremarkable.  Assessment and Plan:  In summary, Leilanie Pitsenbarger is a very pleasant 42 y.o.-year old female with an underlying medical history of Graves' disease, hypertension, reflux disease, smoking and obesity, who presents for evaluation of her recent headaches.  History is supportive of effects of uncontrolled hypertension, may have some migrainous features.  She does not have a prior history of migraines however.  Examination is nonfocal.  She was largely reassured today.  CT angiogram in the ER showed incidental finding of 2 small brain aneurysms.  She has not been referred to neurosurgery yet but I offered to do so.  We mutually agreed not to start her on any migraine medication at this time.  Her headaches have improved.  Blood pressure has also improved.  She may be at risk for obstructive sleep apnea.  We talked about obstructive sleep apnea, prognosis, treatment options and evaluation.  This was an extended visit of over 70 minutes today as we covered multiple issues and extensive chart review was involved as well as counseling and coordination of care.  Below is a summary of the recommendations and discussion points we had today, she was given these detailed instructions in her MyChart after visit summary as well:  <<  We will do a brain scan, called MRI and call you with the test results. We will have to schedule you for this on a separate date. This test requires authorization from your insurance, and we will take care of the insurance process. I will refer you to neurosurgery for your small brain aneurysms. Please make an appointment with your eye doctor as you have not had an eye examination in 2 years and you lost  your prescription eyeglasses.  Strain on the eyes can also cause headaches. We will not start any migraine medication quite yet.  You may have had headaches secondary to blood pressure elevation, we will see how you do and do further workup with a brain MRI and a home sleep test.  Please limit your caffeine intake and try hydrate well with water, follow-up with your primary care as scheduled. Based on your symptoms and your exam I believe you are at risk for obstructive sleep apnea (aka OSA). We should proceed with a sleep study to determine whether you do or do not have OSA and how severe it is. Even, if you have mild OSA, I may want you to consider treatment with CPAP, as treatment of even borderline or mild sleep apnea can result and improvement of symptoms such as sleep disruption, daytime sleepiness, nighttime bathroom breaks, restless leg symptoms, improvement of headache syndromes, even improved mood disorder. >>.    We will plan a follow-up after testing.  I answered all her questions today and she was in agreement with our plan.   Thank you very much for allowing me to participate in the care of this nice patient. If I can be of any further assistance to you please do not hesitate to call me at [phone].  Sincerely,   Huston Foley, MD, PhD

## 2023-06-26 NOTE — Patient Instructions (Addendum)
Thank you for choosing [facility] for your neurologic and sleep related care! It was nice to meet you today!   Here is what we discussed today:  We will do a brain scan, called MRI and call you with the test results. We will have to schedule you for this on a separate date. This test requires authorization from your insurance, and we will take care of the insurance process. I will refer you to neurosurgery for your small brain aneurysms. Please make an appointment with your eye doctor as you have not had an eye examination in 2 years and you lost your prescription eyeglasses.  Strain on the eyes can also cause headaches. We will not start any migraine medication quite yet.  You may have had headaches secondary to blood pressure elevation, we will see how you do and do further workup with a brain MRI and a home sleep test.  Please limit your caffeine intake and try hydrate well with water, follow-up with your primary care as scheduled. Based on your symptoms and your exam I believe you are at risk for obstructive sleep apnea (aka OSA). We should proceed with a sleep study to determine whether you do or do not have OSA and how severe it is. Even, if you have mild OSA, I may want you to consider treatment with CPAP, as treatment of even borderline or mild sleep apnea can result and improvement of symptoms such as sleep disruption, daytime sleepiness, nighttime bathroom breaks, restless leg symptoms, improvement of headache syndromes, even improved mood disorder.   As explained, an attended sleep study (meaning you get to stay overnight in the sleep lab), lets [place] monitor sleep-related behaviors such as sleep talking and leg movements in sleep, in addition to monitoring for sleep apnea.  A home sleep test is a screening tool for sleep apnea diagnosis only, but unfortunately, does not help with any other sleep-related diagnoses.  Please remember, the long-term risks and ramifications of  untreated moderate to severe obstructive sleep apnea may include (but are not limited to): increased risk for cardiovascular disease, including congestive heart failure, stroke, difficult to control hypertension, treatment resistant obesity, arrhythmias, especially irregular heartbeat commonly known as A. Fib. (atrial fibrillation); even type 2 diabetes has been linked to untreated OSA.   Other correlations that untreated obstructive sleep apnea include macular edema which is swelling of the retina in the eyes, droopy eyelid syndrome, and elevated hemoglobin and hematocrit levels (often referred to as polycythemia).  Sleep apnea can cause disruption of sleep and sleep deprivation in most cases, which, in turn, can cause recurrent headaches, problems with memory, mood, concentration, focus, and vigilance. Most people with untreated sleep apnea report excessive daytime sleepiness, which can affect their ability to drive. Please do not drive or use heavy equipment or machinery, if you feel sleepy! Patients with sleep apnea can also develop difficulty initiating and maintaining sleep (aka insomnia).   Having sleep apnea may increase your risk for other sleep disorders, including involuntary behaviors sleep such as sleep terrors, sleep talking, sleepwalking.    Having sleep apnea can also increase your risk for restless leg syndrome and leg movements at night.   Please note that untreated obstructive sleep apnea may carry additional perioperative morbidity. Patients with significant obstructive sleep apnea (typically, in the moderate to severe degree) should receive, if possible, perioperative PAP (positive airway pressure) therapy and the surgeons and particularly the anesthesiologists should be informed of the diagnosis and the severity of the  sleep disordered breathing.   We will call you or email you through MyChart with regards to your test results and plan a follow-up in sleep clinic accordingly. Most  likely, you will hear from one of our nurses.   Our sleep lab administrative assistant will call you to schedule your sleep study and give you further instructions, regarding the check in process for the sleep study, arrival time, what to bring, when you can expect to leave after the study, etc., and to answer any other logistical questions you may have. If you don't hear back from her by about 2 weeks from now, please feel free to call her direct line at [phone] or you can call our general clinic number, or email [place] through My Chart.

## 2023-06-27 ENCOUNTER — Telehealth: Payer: Self-pay | Admitting: Neurology

## 2023-06-27 NOTE — Telephone Encounter (Signed)
Referral for neurosurgery fax to [facility], Phone: [phone], Fax: [phone]

## 2023-07-01 ENCOUNTER — Telehealth: Payer: Self-pay | Admitting: Neurology

## 2023-07-01 NOTE — Telephone Encounter (Signed)
[facility] [place]: [id number] exp. 07/01/23-08/29/23 sent to GI [phone]

## 2023-07-03 ENCOUNTER — Telehealth: Payer: Self-pay | Admitting: Neurology

## 2023-07-03 NOTE — Telephone Encounter (Signed)
[facility] pending uploaded notes  

## 2023-07-08 NOTE — Telephone Encounter (Signed)
[facility] no auth req.

## 2023-07-16 ENCOUNTER — Ambulatory Visit (INDEPENDENT_AMBULATORY_CARE_PROVIDER_SITE_OTHER): Payer: Medicaid Other | Admitting: Family Medicine

## 2023-07-16 ENCOUNTER — Encounter: Payer: Self-pay | Admitting: Family Medicine

## 2023-07-16 VITALS — BP 121/84 | HR 103 | Temp 99.3°F | Resp 16 | Ht 64.0 in | Wt 195.2 lb

## 2023-07-16 DIAGNOSIS — F17200 Nicotine dependence, unspecified, uncomplicated: Secondary | ICD-10-CM

## 2023-07-16 DIAGNOSIS — K219 Gastro-esophageal reflux disease without esophagitis: Secondary | ICD-10-CM | POA: Diagnosis not present

## 2023-07-16 DIAGNOSIS — I10 Essential (primary) hypertension: Secondary | ICD-10-CM

## 2023-07-16 DIAGNOSIS — F17210 Nicotine dependence, cigarettes, uncomplicated: Secondary | ICD-10-CM

## 2023-07-16 MED ORDER — PANTOPRAZOLE SODIUM 20 MG PO TBEC: 20.0000 mg | DELAYED_RELEASE_TABLET | Freq: Every day | ORAL | 1 refills | Status: DC

## 2023-07-16 MED ORDER — HYDROCHLOROTHIAZIDE 25 MG PO TABS: 25.0000 mg | ORAL_TABLET | Freq: Every day | ORAL | 1 refills | Status: DC

## 2023-07-16 MED ORDER — AMLODIPINE BESYLATE 10 MG PO TABS: 10.0000 mg | ORAL_TABLET | Freq: Every day | ORAL | 1 refills | Status: DC

## 2023-07-17 DIAGNOSIS — Z6833 Body mass index (BMI) 33.0-33.9, adult: Secondary | ICD-10-CM | POA: Diagnosis not present

## 2023-07-17 DIAGNOSIS — I671 Cerebral aneurysm, nonruptured: Secondary | ICD-10-CM | POA: Diagnosis not present

## 2023-07-18 ENCOUNTER — Telehealth: Payer: Self-pay | Admitting: Family Medicine

## 2023-07-18 ENCOUNTER — Other Ambulatory Visit: Payer: Self-pay | Admitting: Neurosurgery

## 2023-07-18 DIAGNOSIS — I671 Cerebral aneurysm, nonruptured: Secondary | ICD-10-CM

## 2023-07-22 ENCOUNTER — Other Ambulatory Visit: Payer: Self-pay | Admitting: Neurosurgery

## 2023-07-22 ENCOUNTER — Encounter: Payer: Self-pay | Admitting: Family Medicine

## 2023-07-22 NOTE — Progress Notes (Signed)
Established Patient Office Visit  Subjective    Patient ID: Kellie Bautista, female    DOB: 13-Sep-1980  Age: 42 y.o. MRN: [id number]  CC:  Chief Complaint  Patient presents with   Follow-up    HPI Kellie Bautista presents for follow up of hypertension and GERD> Patient reports taking meds as recommended and denies acute complaints or concerns.   Outpatient Encounter Medications as of 07/16/2023  Medication Sig   famotidine (PEPCID) 20 MG tablet Take 1 tablet (20 mg total) by mouth 2 (two) times daily.   [DISCONTINUED] amLODipine (NORVASC) 10 MG tablet Take 1 tablet (10 mg total) by mouth daily.   [DISCONTINUED] hydrochlorothiazide (HYDRODIURIL) 25 MG tablet Take 1 tablet (25 mg total) by mouth daily.   [DISCONTINUED] pantoprazole (PROTONIX) 20 MG tablet Take 1 tablet (20 mg total) by mouth daily.   amLODipine (NORVASC) 10 MG tablet Take 1 tablet (10 mg total) by mouth daily.   hydrochlorothiazide (HYDRODIURIL) 25 MG tablet Take 1 tablet (25 mg total) by mouth daily.   nicotine (NICODERM CQ) 14 mg/24hr patch Place 1 patch (14 mg total) onto the skin daily. (Patient not taking: Reported on 06/26/2023)   pantoprazole (PROTONIX) 20 MG tablet Take 1 tablet (20 mg total) by mouth daily.   No facility-administered encounter medications on file as of 07/16/2023.    Past Medical History:  Diagnosis Date   Graves disease    Hypertension     Past Surgical History:  Procedure Laterality Date   CESAREAN SECTION      Family History  Problem Relation Age of Onset   Hypertension Mother    Hypertension Brother    Graves' disease Maternal Grandmother    Diabetes Maternal Grandmother     Social History   Socioeconomic History   Marital status: Married    Spouse name: Not on file   Number of children: Not on file   Years of education: Not on file   Highest education level: Not on file  Occupational History   Not on file  Tobacco Use   Smoking status: Every Day    Current  packs/day: 0.25    Types: Cigarettes   Smokeless tobacco: Never  Vaping Use   Vaping status: Never Used  Substance and Sexual Activity   Alcohol use: Yes    Comment: rare   Drug use: Never   Sexual activity: Never  Other Topics Concern   Not on file  Social History Narrative   Caffiene 1 cip coffee dailly   Work: [employer], Office manager. Works days.   Social Determinants of Health   Financial Resource Strain: Low Risk  (06/17/2023)   Overall Financial Resource Strain (CARDIA)    Difficulty of Paying Living Expenses: Not hard at all  Food Insecurity: No Food Insecurity (06/17/2023)   Hunger Vital Sign    Worried About Running Out of Food in the Last Year: Never true    Ran Out of Food in the Last Year: Never true  Transportation Needs: No Transportation Needs (06/17/2023)   PRAPARE - Administrator, Civil Service (Medical): No    Lack of Transportation (Non-Medical): No  Physical Activity: Sufficiently Active (06/17/2023)   Exercise Vital Sign    Days of Exercise per Week: 5 days    Minutes of Exercise per Session: 30 min  Stress: No Stress Concern Present (06/17/2023)   [employer] of Occupational Health - Occupational Stress Questionnaire    Feeling of Stress : Only a little  Social Connections: Moderately Integrated (06/17/2023)   Social Connection and Isolation Panel [NHANES]    Frequency of Communication with Friends and Family: More than three times a week    Frequency of Social Gatherings with Friends and Family: More than three times a week    Attends Religious Services: More than 4 times per year    Active Member of [employer] or Organizations: No    Attends Banker Meetings: Never    Marital Status: Married  Catering manager Violence: Not At Risk (06/17/2023)   Humiliation, Afraid, Rape, and Kick questionnaire    Fear of Current or Ex-Partner: No    Emotionally Abused: No    Physically Abused: No    Sexually Abused: No    Review of  Systems  All other systems reviewed and are negative.       Objective    BP 121/84 (BP Location: Right Arm, Patient Position: Sitting, Cuff Size: Large)   Pulse (!) 103   Temp 99.3 F (37.4 C) (Oral)   Resp 16   Ht 5\' 4"  (1.626 m)   Wt 195 lb 3.2 oz (88.5 kg)   SpO2 96%   BMI 33.51 kg/m   Physical Exam Vitals and nursing note reviewed.  Constitutional:      General: She is not in acute distress. HENT:     Head: Normocephalic and atraumatic.  Cardiovascular:     Rate and Rhythm: Normal rate and regular rhythm.  Pulmonary:     Effort: Pulmonary effort is normal.     Breath sounds: Normal breath sounds.  Abdominal:     Tenderness: There is no abdominal tenderness.  Neurological:     General: No focal deficit present.     Mental Status: She is alert and oriented to person, place, and time.     Motor: No weakness.         Assessment & Plan:  1. Essential hypertension Appears stable. continue  2. Gastroesophageal reflux disease without esophagitis Continue   3. Smoking Discussed reduction/cessation    Return in about 3 months (around 10/16/2023) for follow up.   Tommie Raymond, MD

## 2023-08-09 ENCOUNTER — Other Ambulatory Visit (HOSPITAL_COMMUNITY): Payer: Medicaid Other

## 2023-08-16 ENCOUNTER — Other Ambulatory Visit: Payer: Medicaid Other

## 2023-08-23 ENCOUNTER — Other Ambulatory Visit: Payer: Self-pay | Admitting: Neurosurgery

## 2023-08-23 ENCOUNTER — Ambulatory Visit (HOSPITAL_COMMUNITY)
Admission: RE | Admit: 2023-08-23 | Discharge: 2023-08-23 | Disposition: A | Discharge status: Home / Self Care | Payer: Medicaid Other | Source: Ambulatory Visit | Attending: Neurosurgery | Admitting: Neurosurgery

## 2023-08-23 ENCOUNTER — Encounter (HOSPITAL_COMMUNITY): Payer: Self-pay

## 2023-08-23 ENCOUNTER — Other Ambulatory Visit: Payer: Self-pay

## 2023-08-23 DIAGNOSIS — E0500 Thyrotoxicosis with diffuse goiter without thyrotoxic crisis or storm: Secondary | ICD-10-CM | POA: Insufficient documentation

## 2023-08-23 DIAGNOSIS — I671 Cerebral aneurysm, nonruptured: Secondary | ICD-10-CM | POA: Insufficient documentation

## 2023-08-23 DIAGNOSIS — F17210 Nicotine dependence, cigarettes, uncomplicated: Secondary | ICD-10-CM | POA: Insufficient documentation

## 2023-08-23 DIAGNOSIS — I10 Essential (primary) hypertension: Secondary | ICD-10-CM | POA: Diagnosis not present

## 2023-08-23 HISTORY — PX: IR ANGIO VERTEBRAL SEL VERTEBRAL BILAT MOD SED: IMG5369

## 2023-08-23 HISTORY — PX: IR 3D INDEPENDENT WKST: IMG2385

## 2023-08-23 HISTORY — PX: IR ANGIO INTRA EXTRACRAN SEL INTERNAL CAROTID BILAT MOD SED: IMG5363

## 2023-08-23 LAB — CBC WITH DIFFERENTIAL/PLATELET
Abs Immature Granulocytes: 0.02 10*3/uL (ref 0.00–0.07)
Basophils Absolute: 0.1 10*3/uL (ref 0.0–0.1)
Basophils Relative: 1 %
Eosinophils Absolute: 0.4 10*3/uL (ref 0.0–0.5)
Eosinophils Relative: 5 %
HCT: 39.8 % (ref 36.0–46.0)
Hemoglobin: 13.4 g/dL (ref 12.0–15.0)
Immature Granulocytes: 0 %
Lymphocytes Relative: 27 %
Lymphs Abs: 2.2 10*3/uL (ref 0.7–4.0)
MCH: 27.1 pg (ref 26.0–34.0)
MCHC: 33.7 g/dL (ref 30.0–36.0)
MCV: 80.4 fL (ref 80.0–100.0)
Monocytes Absolute: 0.9 10*3/uL (ref 0.1–1.0)
Monocytes Relative: 11 %
Neutro Abs: 4.6 10*3/uL (ref 1.7–7.7)
Neutrophils Relative %: 56 %
Platelets: 261 10*3/uL (ref 150–400)
RBC: 4.95 MIL/uL (ref 3.87–5.11)
RDW: 14.6 % (ref 11.5–15.5)
WBC: 8.1 10*3/uL (ref 4.0–10.5)
nRBC: 0 % (ref 0.0–0.2)

## 2023-08-23 LAB — URINALYSIS, ROUTINE W REFLEX MICROSCOPIC
Bilirubin Urine: NEGATIVE
Glucose, UA: NEGATIVE mg/dL
Hgb urine dipstick: NEGATIVE
Ketones, ur: NEGATIVE mg/dL
Leukocytes,Ua: NEGATIVE
Nitrite: NEGATIVE
Protein, ur: NEGATIVE mg/dL
Specific Gravity, Urine: 1.009 (ref 1.005–1.030)
pH: 7 (ref 5.0–8.0)

## 2023-08-23 LAB — BASIC METABOLIC PANEL
Anion gap: 10 (ref 5–15)
BUN: 9 mg/dL (ref 6–20)
CO2: 25 mmol/L (ref 22–32)
Calcium: 9.4 mg/dL (ref 8.9–10.3)
Chloride: 102 mmol/L (ref 98–111)
Creatinine, Ser: 0.75 mg/dL (ref 0.44–1.00)
GFR, Estimated: >60 mL/min (ref >60)
Glucose, Bld: 93 mg/dL (ref 70–99)
Potassium: 3.3 mmol/L — ABNORMAL LOW (ref 3.5–5.1)
Sodium: 137 mmol/L (ref 135–145)

## 2023-08-23 LAB — PROTIME-INR
INR: 1 (ref 0.8–1.2)
Prothrombin Time: 13.7 s (ref 11.4–15.2)

## 2023-08-23 LAB — APTT: aPTT: 32 s (ref 24–36)

## 2023-08-23 MED ORDER — CEFAZOLIN SODIUM-DEXTROSE 2-4 GM/100ML-% IV SOLN: 2.0000 g | INTRAVENOUS | Status: DC

## 2023-08-23 MED ORDER — CHLORHEXIDINE GLUCONATE CLOTH 2 % EX PADS: 6.0000 | MEDICATED_PAD | Freq: Once | CUTANEOUS | Status: DC

## 2023-08-23 MED ORDER — HYDROCODONE-ACETAMINOPHEN 5-325 MG PO TABS
1.0000 | ORAL_TABLET | ORAL | Status: DC | PRN
  Administered 2023-08-23: 1 via ORAL
  Filled 2023-08-23: qty 1

## 2023-08-23 MED ORDER — IOHEXOL 300 MG/ML  SOLN
150.0000 mL | Freq: Once | INTRAMUSCULAR | Status: AC | PRN
  Administered 2023-08-23: 80 mL via INTRA_ARTERIAL

## 2023-08-23 MED ORDER — FENTANYL CITRATE (PF) 100 MCG/2ML IJ SOLN
INTRAMUSCULAR | Status: AC
  Filled 2023-08-23: qty 4

## 2023-08-23 MED ORDER — FENTANYL CITRATE (PF) 100 MCG/2ML IJ SOLN
INTRAMUSCULAR | Status: AC | PRN
  Administered 2023-08-23 (×2): 25 ug via INTRAVENOUS
  Administered 2023-08-23: 50 ug via INTRAVENOUS
  Administered 2023-08-23 (×2): 25 ug via INTRAVENOUS

## 2023-08-23 MED ORDER — LIDOCAINE HCL 1 % IJ SOLN
INTRAMUSCULAR | Status: AC
Start: 2023-08-23 — End: ?
  Filled 2023-08-23: qty 20

## 2023-08-23 MED ORDER — HEPARIN SODIUM (PORCINE) 1000 UNIT/ML IJ SOLN
INTRAMUSCULAR | Status: AC
  Filled 2023-08-23: qty 10

## 2023-08-23 MED ORDER — MIDAZOLAM HCL 2 MG/2ML IJ SOLN
INTRAMUSCULAR | Status: AC
Start: 2023-08-23 — End: ?
  Filled 2023-08-23: qty 4

## 2023-08-23 MED ORDER — HEPARIN SODIUM (PORCINE) 1000 UNIT/ML IJ SOLN
INTRAMUSCULAR | Status: AC | PRN
  Administered 2023-08-23: 2000 [IU] via INTRAVENOUS

## 2023-08-23 MED ORDER — LIDOCAINE HCL 1 % IJ SOLN
20.0000 mL | Freq: Once | INTRAMUSCULAR | Status: AC
  Administered 2023-08-23: 10 mL via INTRADERMAL

## 2023-08-23 MED ORDER — IOHEXOL 300 MG/ML  SOLN
100.0000 mL | Freq: Once | INTRAMUSCULAR | Status: AC | PRN
  Administered 2023-08-23: 21 mL via INTRA_ARTERIAL

## 2023-08-23 MED ORDER — MIDAZOLAM HCL 2 MG/2ML IJ SOLN
INTRAMUSCULAR | Status: AC | PRN
  Administered 2023-08-23: .5 mg via INTRAVENOUS
  Administered 2023-08-23: 1 mg via INTRAVENOUS
  Administered 2023-08-23: .5 mg via INTRAVENOUS

## 2023-08-23 NOTE — Progress Notes (Signed)
Patient and husband was given discharge instructions. Both verbalized understanding. 

## 2023-08-23 NOTE — H&P (Signed)
Chief Complaint   Aneurysm  History of Present Illness  Kellie Bautista is a 42 year old woman seen for initial consultation at the request of Dr. Frances Furbish. She is referred for evaluation of recently discovered intracranial aneurysm. Briefly, the patient has a history of hypertension although by her own admission she has been poorly compliant with her medication. Unfortunately this has led to multiple previous hypertensive crises. She had severe headache related to hypertensive crisis about a month ago for which she was seen in the emergency department. CT scan and CT angiogram were both done which thankfully did not reveal any subarachnoid hemorrhage however she was found to have possible intracranial aneurysms. She was therefore referred for outpatient Neurosurgical consultation. Thankfully, since his last visit she has been more cognizant about taking her antihypertensives and her blood pressure has been better controlled.  Of note, she does again have hypertension as described above. No history of diabetes. She does have Graves disease. No previous heart attack or stroke. No known lung, liver, kidney disease. No cancer history. She is not on any blood thinners or antiplatelet agents. She smokes half pack a day. There is no family history of intracranial aneurysms   Past Medical History   Past Medical History:  Diagnosis Date   Graves disease    Hypertension     Past Surgical History   Past Surgical History:  Procedure Laterality Date   CESAREAN SECTION     TUBAL LIGATION     2008    Social History   Social History   Tobacco Use   Smoking status: Every Day    Current packs/day: 0.25    Types: Cigarettes   Smokeless tobacco: Never  Vaping Use   Vaping status: Never Used  Substance Use Topics   Alcohol use: Yes    Comment: rare   Drug use: Never    Medications   Prior to Admission medications   Medication Sig Start Date End Date Taking? Authorizing Provider  amLODipine  (NORVASC) 10 MG tablet Take 1 tablet (10 mg total) by mouth daily. 07/16/23  Yes Georganna Skeans, MD  hydrochlorothiazide (HYDRODIURIL) 25 MG tablet Take 1 tablet (25 mg total) by mouth daily. 07/16/23  Yes Georganna Skeans, MD  famotidine (PEPCID) 20 MG tablet Take 1 tablet (20 mg total) by mouth 2 (two) times daily. 01/27/23   [name], PA-C  nicotine (NICODERM CQ) 14 mg/24hr patch Place 1 patch (14 mg total) onto the skin daily. Patient not taking: Reported on 06/26/2023 06/17/23   Georganna Skeans, MD  pantoprazole (PROTONIX) 20 MG tablet Take 1 tablet (20 mg total) by mouth daily. 07/16/23   Georganna Skeans, MD    Allergies   Allergies  Allergen Reactions   Shrimp (Diagnostic) Nausea And Vomiting    Review of Systems  ROS  Neurologic Exam  Awake, alert, oriented Memory and concentration grossly intact Speech fluent, appropriate CN grossly intact Motor exam: Upper Extremities Deltoid Bicep Tricep Grip  Right 5/5 5/5 5/5 5/5  Left 5/5 5/5 5/5 5/5   Lower Extremities IP Quad PF DF EHL  Right 5/5 5/5 5/5 5/5 5/5  Left 5/5 5/5 5/5 5/5 5/5   Sensation grossly intact to LT  Imaging  CTA suggests possible paraclinoid LICA and possible left Pcom aneurysms  Impression  - 43 y.o. 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 We have also reviewed in detail the risks, benefits, and alternatives to the procedure. All questions were answered and Kensli Garrels provided informed consent to proceed.  Lisbeth Renshaw, MD [facility]

## 2023-09-04 ENCOUNTER — Other Ambulatory Visit: Payer: Self-pay

## 2023-09-04 ENCOUNTER — Encounter (HOSPITAL_COMMUNITY): Payer: Self-pay | Admitting: Emergency Medicine

## 2023-09-04 ENCOUNTER — Emergency Department (HOSPITAL_COMMUNITY): Payer: Medicaid Other

## 2023-09-04 ENCOUNTER — Observation Stay (HOSPITAL_COMMUNITY): Payer: Medicaid Other

## 2023-09-04 ENCOUNTER — Inpatient Hospital Stay (HOSPITAL_COMMUNITY)
Admission: EM | Admit: 2023-09-04 | Discharge: 2023-09-06 | DRG: 641 | Disposition: A | Discharge status: Home / Self Care | Payer: Medicaid Other | Attending: Family Medicine | Admitting: Family Medicine

## 2023-09-04 DIAGNOSIS — R1110 Vomiting, unspecified: Secondary | ICD-10-CM | POA: Diagnosis not present

## 2023-09-04 DIAGNOSIS — N179 Acute kidney failure, unspecified: Secondary | ICD-10-CM | POA: Diagnosis not present

## 2023-09-04 DIAGNOSIS — E876 Hypokalemia: Secondary | ICD-10-CM | POA: Diagnosis present

## 2023-09-04 DIAGNOSIS — R252 Cramp and spasm: Secondary | ICD-10-CM

## 2023-09-04 DIAGNOSIS — E039 Hypothyroidism, unspecified: Secondary | ICD-10-CM | POA: Diagnosis present

## 2023-09-04 DIAGNOSIS — I671 Cerebral aneurysm, nonruptured: Secondary | ICD-10-CM | POA: Diagnosis present

## 2023-09-04 DIAGNOSIS — I10 Essential (primary) hypertension: Secondary | ICD-10-CM | POA: Diagnosis present

## 2023-09-04 DIAGNOSIS — Z8249 Family history of ischemic heart disease and other diseases of the circulatory system: Secondary | ICD-10-CM

## 2023-09-04 DIAGNOSIS — Z91013 Allergy to seafood: Secondary | ICD-10-CM

## 2023-09-04 DIAGNOSIS — R519 Headache, unspecified: Secondary | ICD-10-CM | POA: Diagnosis not present

## 2023-09-04 DIAGNOSIS — K529 Noninfective gastroenteritis and colitis, unspecified: Secondary | ICD-10-CM | POA: Diagnosis present

## 2023-09-04 DIAGNOSIS — R1013 Epigastric pain: Secondary | ICD-10-CM | POA: Diagnosis not present

## 2023-09-04 DIAGNOSIS — Z716 Tobacco abuse counseling: Secondary | ICD-10-CM

## 2023-09-04 DIAGNOSIS — F17210 Nicotine dependence, cigarettes, uncomplicated: Secondary | ICD-10-CM | POA: Diagnosis present

## 2023-09-04 DIAGNOSIS — R079 Chest pain, unspecified: Secondary | ICD-10-CM | POA: Diagnosis not present

## 2023-09-04 DIAGNOSIS — E860 Dehydration: Principal | ICD-10-CM | POA: Diagnosis present

## 2023-09-04 DIAGNOSIS — Z79899 Other long term (current) drug therapy: Secondary | ICD-10-CM

## 2023-09-04 DIAGNOSIS — R0789 Other chest pain: Secondary | ICD-10-CM | POA: Diagnosis not present

## 2023-09-04 LAB — CBC
HCT: 44.1 % (ref 36.0–46.0)
Hemoglobin: 14.8 g/dL (ref 12.0–15.0)
MCH: 26.6 pg (ref 26.0–34.0)
MCHC: 33.6 g/dL (ref 30.0–36.0)
MCV: 79.3 fL — ABNORMAL LOW (ref 80.0–100.0)
Platelets: 329 10*3/uL (ref 150–400)
RBC: 5.56 MIL/uL — ABNORMAL HIGH (ref 3.87–5.11)
RDW: 14.3 % (ref 11.5–15.5)
WBC: 15.8 10*3/uL — ABNORMAL HIGH (ref 4.0–10.5)
nRBC: 0 % (ref 0.0–0.2)

## 2023-09-04 LAB — CBC WITH DIFFERENTIAL/PLATELET
Abs Immature Granulocytes: 0.04 10*3/uL (ref 0.00–0.07)
Basophils Absolute: 0.1 10*3/uL (ref 0.0–0.1)
Basophils Relative: 1 %
Eosinophils Absolute: 0 10*3/uL (ref 0.0–0.5)
Eosinophils Relative: 0 %
HCT: 50.3 % — ABNORMAL HIGH (ref 36.0–46.0)
Hemoglobin: 17 g/dL — ABNORMAL HIGH (ref 12.0–15.0)
Immature Granulocytes: 0 %
Lymphocytes Relative: 20 %
Lymphs Abs: 2.2 10*3/uL (ref 0.7–4.0)
MCH: 26.6 pg (ref 26.0–34.0)
MCHC: 33.8 g/dL (ref 30.0–36.0)
MCV: 78.8 fL — ABNORMAL LOW (ref 80.0–100.0)
Monocytes Absolute: 0.5 10*3/uL (ref 0.1–1.0)
Monocytes Relative: 5 %
Neutro Abs: 7.9 10*3/uL — ABNORMAL HIGH (ref 1.7–7.7)
Neutrophils Relative %: 74 %
Platelets: 407 10*3/uL — ABNORMAL HIGH (ref 150–400)
RBC: 6.38 MIL/uL — ABNORMAL HIGH (ref 3.87–5.11)
RDW: 14.5 % (ref 11.5–15.5)
WBC: 10.6 10*3/uL — ABNORMAL HIGH (ref 4.0–10.5)
nRBC: 0 % (ref 0.0–0.2)

## 2023-09-04 LAB — LACTIC ACID, PLASMA
Lactic Acid, Venous: 1.3 mmol/L (ref 0.5–1.9)
Lactic Acid, Venous: 1.8 mmol/L (ref 0.5–1.9)

## 2023-09-04 LAB — COMPREHENSIVE METABOLIC PANEL
ALT: 24 U/L (ref 0–44)
AST: 28 U/L (ref 15–41)
Albumin: 5.2 g/dL — ABNORMAL HIGH (ref 3.5–5.0)
Alkaline Phosphatase: 83 U/L (ref 38–126)
Anion gap: 18 — ABNORMAL HIGH (ref 5–15)
BUN: 16 mg/dL (ref 6–20)
CO2: 20 mmol/L — ABNORMAL LOW (ref 22–32)
Calcium: 11.8 mg/dL — ABNORMAL HIGH (ref 8.9–10.3)
Chloride: 99 mmol/L (ref 98–111)
Creatinine, Ser: 2.34 mg/dL — ABNORMAL HIGH (ref 0.44–1.00)
GFR, Estimated: 26 mL/min — ABNORMAL LOW (ref >60)
Glucose, Bld: 163 mg/dL — ABNORMAL HIGH (ref 70–99)
Potassium: 3.6 mmol/L (ref 3.5–5.1)
Sodium: 137 mmol/L (ref 135–145)
Total Bilirubin: 0.7 mg/dL (ref 0.0–1.2)
Total Protein: 9.7 g/dL — ABNORMAL HIGH (ref 6.5–8.1)

## 2023-09-04 LAB — CREATININE, SERUM
Creatinine, Ser: 1.9 mg/dL — ABNORMAL HIGH (ref 0.44–1.00)
GFR, Estimated: 33 mL/min — ABNORMAL LOW (ref >60)

## 2023-09-04 LAB — TROPONIN I (HIGH SENSITIVITY)
Troponin I (High Sensitivity): 4 ng/L (ref <18)
Troponin I (High Sensitivity): 4 ng/L (ref <18)

## 2023-09-04 LAB — LIPASE, BLOOD: Lipase: 62 U/L — ABNORMAL HIGH (ref 11–51)

## 2023-09-04 LAB — MAGNESIUM: Magnesium: 2.1 mg/dL (ref 1.7–2.4)

## 2023-09-04 LAB — HCG, SERUM, QUALITATIVE: Preg, Serum: NEGATIVE

## 2023-09-04 LAB — TSH: TSH: 2.128 u[IU]/mL (ref 0.350–4.500)

## 2023-09-04 LAB — CK: Total CK: 34 U/L — ABNORMAL LOW (ref 38–234)

## 2023-09-04 LAB — HIV ANTIBODY (ROUTINE TESTING W REFLEX): HIV Screen 4th Generation wRfx: NONREACTIVE

## 2023-09-04 MED ORDER — PANTOPRAZOLE SODIUM 20 MG PO TBEC
20.0000 mg | DELAYED_RELEASE_TABLET | Freq: Every day | ORAL | Status: DC
  Administered 2023-09-04 – 2023-09-06 (×3): 20 mg via ORAL
  Filled 2023-09-04 (×3): qty 1

## 2023-09-04 MED ORDER — SODIUM CHLORIDE 0.9 % IV SOLN: INTRAVENOUS | Status: DC

## 2023-09-04 MED ORDER — MORPHINE SULFATE (PF) 2 MG/ML IV SOLN
2.0000 mg | INTRAVENOUS | Status: DC | PRN
  Administered 2023-09-04 – 2023-09-05 (×3): 2 mg via INTRAVENOUS
  Filled 2023-09-04 (×3): qty 1

## 2023-09-04 MED ORDER — ONDANSETRON HCL 4 MG/2ML IJ SOLN
4.0000 mg | Freq: Four times a day (QID) | INTRAMUSCULAR | Status: DC | PRN
  Administered 2023-09-05 (×2): 4 mg via INTRAVENOUS
  Filled 2023-09-04 (×2): qty 2

## 2023-09-04 MED ORDER — HYDRALAZINE HCL 20 MG/ML IJ SOLN: 10.0000 mg | Freq: Four times a day (QID) | INTRAMUSCULAR | Status: DC | PRN

## 2023-09-04 MED ORDER — LACTATED RINGERS IV BOLUS
1000.0000 mL | Freq: Once | INTRAVENOUS | Status: AC
  Administered 2023-09-04: 1000 mL via INTRAVENOUS

## 2023-09-04 MED ORDER — ONDANSETRON HCL 4 MG/2ML IJ SOLN
4.0000 mg | Freq: Once | INTRAMUSCULAR | Status: AC
  Administered 2023-09-04: 4 mg via INTRAVENOUS
  Filled 2023-09-04: qty 2

## 2023-09-04 MED ORDER — ACETAMINOPHEN 650 MG RE SUPP: 650.0000 mg | Freq: Four times a day (QID) | RECTAL | Status: DC | PRN

## 2023-09-04 MED ORDER — HYDROMORPHONE HCL 1 MG/ML IJ SOLN
1.0000 mg | Freq: Once | INTRAMUSCULAR | Status: AC
  Administered 2023-09-04: 1 mg via INTRAVENOUS
  Filled 2023-09-04: qty 1

## 2023-09-04 MED ORDER — HEPARIN SODIUM (PORCINE) 5000 UNIT/ML IJ SOLN
5000.0000 [IU] | Freq: Three times a day (TID) | INTRAMUSCULAR | Status: DC
  Administered 2023-09-04: 5000 [IU] via SUBCUTANEOUS
  Filled 2023-09-04 (×3): qty 1

## 2023-09-04 MED ORDER — NICOTINE 14 MG/24HR TD PT24
14.0000 mg | MEDICATED_PATCH | Freq: Every day | TRANSDERMAL | Status: DC
  Administered 2023-09-04 – 2023-09-05 (×2): 14 mg via TRANSDERMAL
  Filled 2023-09-04 (×3): qty 1

## 2023-09-04 MED ORDER — ACETAMINOPHEN 325 MG PO TABS
650.0000 mg | ORAL_TABLET | Freq: Four times a day (QID) | ORAL | Status: DC | PRN
  Administered 2023-09-04 – 2023-09-05 (×2): 650 mg via ORAL
  Filled 2023-09-04 (×2): qty 2

## 2023-09-04 MED ORDER — ONDANSETRON HCL 4 MG PO TABS: 4.0000 mg | ORAL_TABLET | Freq: Four times a day (QID) | ORAL | Status: DC | PRN

## 2023-09-04 NOTE — Progress Notes (Signed)
 Lunch tray ordered for pt.

## 2023-09-04 NOTE — ED Provider Notes (Signed)
 [facility]PT [facility] Emergency Department Provider Note MRN:  [id number]  Arrival date & time: 09/04/23     Chief Complaint   Headache and Chest Pain   History of Present Illness   Kellie Bautista is a 43 y.o. year-old female presents to the ED with chief complaint of nausea, vomiting, and diarrhea for the past day.  Reports associated muscle cramps that started a several hours ago.  Had recent diagnostic cerebral angiogram that showed multiple small aneurysms.    History provided by patient.   Review of Systems  Pertinent positive and negative review of systems noted in HPI.    Physical Exam   Vitals:   09/04/23 0108  BP: 118/84  Pulse: (!) 127  Resp: (!) 24  Temp: (!) 97.5 F (36.4 C)  SpO2: 100%    CONSTITUTIONAL:  uncomfortable-appearing,  NEURO:  Alert and oriented x 3, CN 3-12 grossly intact EYES:  eyes equal and reactive ENT/NECK:  Supple, no stridor  CARDIO:  tachycardic, regular rhythm, appears well-perfused  PULM:  No respiratory distress, CTAB GI/GU:  non-distended, generalized abdominal discomfort MSK/SPINE:  No gross deformities, no edema, moves all extremities  SKIN:  no rash, atraumatic   *Additional and/or pertinent findings included in MDM below  Diagnostic and Interventional Summary    EKG Interpretation Date/Time:  Wednesday September 04 2023 01:11:13 EST Ventricular Rate:  132 PR Interval:  112 QRS Duration:  72 QT Interval:  316 QTC Calculation: 468 R Axis:   54  Text Interpretation: Sinus tachycardia Right atrial enlargement Nonspecific ST abnormality Abnormal ECG No previous ECGs available Confirmed by Haze Lonni PARAS [phone]) on 09/04/2023 4:53:26 AM       Labs Reviewed  CBC WITH DIFFERENTIAL/PLATELET - Abnormal; Notable for the following components:      Result Value   WBC 10.6 (*)    RBC 6.38 (*)    Hemoglobin 17.0 (*)    HCT 50.3 (*)    MCV 78.8 (*)    Platelets 407 (*)    Neutro Abs 7.9 (*)    All other  components within normal limits  COMPREHENSIVE METABOLIC PANEL - Abnormal; Notable for the following components:   CO2 20 (*)    Glucose, Bld 163 (*)    Creatinine, Ser 2.34 (*)    Calcium 11.8 (*)    Total Protein 9.7 (*)    Albumin 5.2 (*)    GFR, Estimated 26 (*)    Anion gap 18 (*)    All other components within normal limits  LIPASE, BLOOD - Abnormal; Notable for the following components:   Lipase 62 (*)    All other components within normal limits  HCG, SERUM, QUALITATIVE  URINALYSIS, W/ REFLEX TO CULTURE (INFECTION SUSPECTED)  URINALYSIS, ROUTINE W REFLEX MICROSCOPIC  CK  TROPONIN I (HIGH SENSITIVITY)  TROPONIN I (HIGH SENSITIVITY)    CT HEAD WO CONTRAST ( )  Final Result    DG Chest 1 View  Final Result      Medications  lactated ringers  bolus 1,000 mL (has no administration in time range)  HYDROmorphone  (DILAUDID ) injection 1 mg (has no administration in time range)  lactated ringers  bolus 1,000 mL (has no administration in time range)  ondansetron  (ZOFRAN ) injection 4 mg (has no administration in time range)     Procedures  /  Critical Care Procedures  ED Course and Medical Decision Making  I have reviewed the triage vital signs, the nursing notes, and pertinent available records from the EMR.  Social Determinants Affecting Complexity of Care: Patient has no clinically significant social determinants affecting this chief complaint..   ED Course: Clinical Course as of 09/04/23 0610  Wed Sep 04, 2023  0607 Comprehensive metabolic panel(!) Cr elevated from recent on 12/20 0.74->2.34, GFR decreased, concern for AKI [RB]  0608 CBC with Differential(!) Appears hemoconcentrated, likely 2/2 vomiting and diarrhea. [RB]  0609 hCG, serum, qualitative Preg negative [RB]    Clinical Course User Index [RB] Kellie Charleston, PA-C    Medical Decision Making Patient here with nausea, vomiting, and diarrhea for the past day or so.  Has also had muscle cramps for  the past couple of hours.  She appears dry on exam.  Will give fluids and treat pain from muscle cramps and nausea.  Labs are consistent with AKI.  Patient has had some muscle cramps. LFTs aren't elevated, but will add CK and UA to look for any evidence of rhabdo.    Her abdominal cramps are thought to be 2/2 vomiting and probably viral gastroenteritis.  Lipase is mildly elevated.  Pancreatitis is considered, but lipase is only mildly elevated at 62 and in the setting of n/v and diarrhea, my suspicion for pancreatitis is lower.  Amount and/or Complexity of Data Reviewed Labs: ordered. Decision-making details documented in ED Course.  Risk Prescription drug management. Decision regarding hospitalization.         Consultants: I consulted with Hospitalist, Dr. Charlton, who is appreciated for admitting.   Treatment and Plan: Patient's exam and diagnostic results are concerning for AKI.  Feel that patient will need admission to the hospital for further treatment and evaluation.    Final Clinical Impressions(s) / ED Diagnoses     ICD-10-CM   1. AKI (acute kidney injury) (HCC)  N17.9     2. Muscle cramps  R25.2       ED Discharge Orders     None         Discharge Instructions Discussed with and Provided to Patient:   Discharge Instructions   None      Kellie Charleston, PA-C 09/04/23 9380    Haze Lonni PARAS, MD 09/05/23 0110

## 2023-09-04 NOTE — ED Triage Notes (Signed)
 Pt with headache, chest pain, n/v that began today.  Hx of aneurysm and followed by neuro pt report.  Pt also reports abdominal pain.

## 2023-09-04 NOTE — H&P (Signed)
 History and Physical    Kellie Bautista FMW:[id number] DOB: 09-Nov-1980 DOA: 09/04/2023  PCP: Tanda Bleacher, MD  Patient coming from: Home  I have personally briefly reviewed patient's old medical records in [facility]  Chief Complaint: Nausea vomiting diarrhea and abdominal pain since 2 days  HPI: Kellie Bautista is a 43 y.o. female with medical history significant of Graves' disease, hypertension, intracranial aneurysm status post cerebral angiogram on 08/23/2023, tobacco abuse presenting here with complaining of nausea, vomiting, diarrhea, abdominal pain since 2 days.  Reports that she has a history of intracranial aneurysm followed by neurosurgery and underwent cerebral angiogram that showed multiple small aneurysm about 10 days ago.  She was having some headaches, not feeling well, abdominal pain, nausea, vomiting and diarrhea, poor appetite, not feeling well overall.  Too many episodes to count.  Denies melena or hematemesis.  No recent sick contact.  No fever or chills.  No history of pancreatitis or alcohol abuse.  She does smoke cigarettes.  She has been compliant with current antihypertensive.  She does have a Graves' disease and underwent radioactive iodine ablation in the past and currently not on any treatment.    Denies blurry vision, slurred speech, facial droop, numbness weakness tingling sensation in upper or lower extremities.  Denies any UTI symptoms.   ED Course: Upon arrival: Temperature 97.5, pulse 127, RR: 24, BP: 118/84, maintaining oxygen saturation on room air.  WBC: 10.6, H&H: 7.0/50.3, PLT: 407.  NA: 137, K: 3.6, BUN: 16, creatinine: 2.34, GFR: 26.  Lipase: 62.  Patient was given IV fluids.  [facility] hospitalist consulted for admission for AKI  Review of Systems: As per HPI otherwise negative.    Past Medical History:  Diagnosis Date   Graves disease    Hypertension     Past Surgical History:  Procedure Laterality Date   CESAREAN SECTION     IR 3D INDEPENDENT  [facility]  08/23/2023   IR ANGIO INTRA EXTRACRAN SEL INTERNAL CAROTID BILAT MOD SED  08/23/2023   IR ANGIO VERTEBRAL SEL VERTEBRAL BILAT MOD SED  08/23/2023   TUBAL LIGATION     2008     reports that she has been smoking cigarettes. She has never used smokeless tobacco. She reports current alcohol use. She reports that she does not use drugs.  Allergies  Allergen Reactions   Shrimp (Diagnostic) Nausea And Vomiting    Family History  Problem Relation Age of Onset   Hypertension Mother    Hypertension Brother    Yvone' disease Maternal Grandmother    Diabetes Maternal Grandmother     Prior to Admission medications   Medication Sig Start Date End Date Taking? Authorizing Provider  amLODipine  (NORVASC ) 10 MG tablet Take 1 tablet (10 mg total) by mouth daily. 07/16/23  Yes Tanda Bleacher, MD  hydrochlorothiazide  (HYDRODIURIL ) 25 MG tablet Take 1 tablet (25 mg total) by mouth daily. 07/16/23  Yes Tanda Bleacher, MD  pantoprazole  (PROTONIX ) 20 MG tablet Take 1 tablet (20 mg total) by mouth daily. 07/16/23   Tanda Bleacher, MD    Physical Exam: Vitals:   09/04/23 0550 09/04/23 0748 09/04/23 0759 09/04/23 0800  BP: 110/88 (!) 120/94  131/86  Pulse: (!) 106 83  82  Resp: 19 15  (!) 21  Temp:   98.5 F (36.9 C)   TempSrc:   Oral   SpO2: 99% 97%  100%    Constitutional: NAD, calm, comfortable, on room air, communicating well, appears weak and lethargic and dehydrated Eyes: PERRL,  lids and conjunctivae normal ENMT: Mucous membranes are dry. Posterior pharynx clear of any exudate or lesions.Normal dentition.  Neck: normal, supple, no masses, no thyromegaly Respiratory: clear to auscultation bilaterally, no wheezing, no crackles. Normal respiratory effort. No accessory muscle use.  Cardiovascular: Regular rate and rhythm, no murmurs / rubs / gallops. No extremity edema. 2+ pedal pulses. No carotid bruits.  Abdomen: Epigastric tenderness positive.  No guarding, no rigidity, no  hepatosplenomegaly.  Bowel sounds positive  musculoskeletal: no clubbing / cyanosis. No joint deformity upper and lower extremities. Good ROM, no contractures. Normal muscle tone.  Skin: no rashes, lesions, ulcers. No induration Neurologic: CN 2-12 grossly intact. Sensation intact, DTR normal. Strength 5/5 in all 4.  Psychiatric: Normal judgment and insight. Alert and oriented x 3. Normal mood.    Labs on Admission: I have personally reviewed following labs and imaging studies  CBC: Recent Labs  Lab 09/04/23 0128  WBC 10.6*  NEUTROABS 7.9*  HGB 17.0*  HCT 50.3*  MCV 78.8*  PLT 407*   Basic Metabolic Panel: Recent Labs  Lab 09/04/23 0128  NA 137  K 3.6  CL 99  CO2 20*  GLUCOSE 163*  BUN 16  CREATININE 2.34*  CALCIUM 11.8*   GFR: Estimated Creatinine Clearance: 33.1 mL/min (A) (by C-G formula based on SCr of 2.34 mg/dL (H)). Liver Function Tests: Recent Labs  Lab 09/04/23 0128  AST 28  ALT 24  ALKPHOS 83  BILITOT 0.7  PROT 9.7*  ALBUMIN 5.2*   Recent Labs  Lab 09/04/23 0128  LIPASE 62*   No results for input(s): AMMONIA in the last 168 hours. Coagulation Profile: No results for input(s): INR, PROTIME in the last 168 hours. Cardiac Enzymes: No results for input(s): CKTOTAL, CKMB, CKMBINDEX, TROPONINI in the last 168 hours. BNP (last 3 results) No results for input(s): PROBNP in the last 8760 hours. HbA1C: No results for input(s): HGBA1C in the last 72 hours. CBG: No results for input(s): GLUCAP in the last 168 hours. Lipid Profile: No results for input(s): CHOL, HDL, LDLCALC, TRIG, CHOLHDL, LDLDIRECT in the last 72 hours. Thyroid Function Tests: No results for input(s): TSH, T4TOTAL, FREET4, T3FREE, THYROIDAB in the last 72 hours. Anemia Panel: No results for input(s): VITAMINB12, FOLATE, FERRITIN, TIBC, IRON, RETICCTPCT in the last 72 hours. Urine analysis:    Component Value Date/Time    COLORURINE YELLOW 08/23/2023 0919   APPEARANCEUR CLEAR 08/23/2023 0919   LABSPEC 1.009 08/23/2023 0919   PHURINE 7.0 08/23/2023 0919   GLUCOSEU NEGATIVE 08/23/2023 0919   HGBUR NEGATIVE 08/23/2023 0919   BILIRUBINUR NEGATIVE 08/23/2023 0919   KETONESUR NEGATIVE 08/23/2023 0919   PROTEINUR NEGATIVE 08/23/2023 0919   NITRITE NEGATIVE 08/23/2023 0919   LEUKOCYTESUR NEGATIVE 08/23/2023 0919    Radiological Exams on Admission: CT HEAD WO CONTRAST ( ) Result Date: 09/04/2023 CLINICAL DATA:  Headache with history of aneurysm EXAM: CT HEAD WITHOUT CONTRAST TECHNIQUE: Contiguous axial images were obtained from the base of the skull through the vertex without intravenous contrast. RADIATION DOSE REDUCTION: This exam was performed according to the departmental dose-optimization program which includes automated exposure control, adjustment of the mA and/or kV according to patient size and/or use of iterative reconstruction technique. COMPARISON:  None Available. FINDINGS: Brain: No mass,hemorrhage or extra-axial collection. Normal appearance of the parenchyma and CSF spaces. Vascular: No hyperdense vessel or unexpected vascular calcification. Skull: The visualized skull base, calvarium and extracranial soft tissues are normal. Sinuses/Orbits: No fluid levels or advanced mucosal thickening of the visualized  paranasal sinuses. No mastoid or middle ear effusion. Normal orbits. Other: None. IMPRESSION: Normal head CT. Electronically Signed   By: Franky Stanford M.D.   On: 09/04/2023 03:23   DG Chest 1 View Result Date: 09/04/2023 CLINICAL DATA:  Chest pain and vomiting EXAM: CHEST  1 VIEW COMPARISON:  01/04/2009 FINDINGS: Cardiac shadow is within normal limits. Lungs are well aerated bilaterally. No focal infiltrate or effusion is noted. No bony abnormality is seen. IMPRESSION: No active disease. Electronically Signed   By: Oneil Devonshire M.D.   On: 09/04/2023 01:44    EKG: Independently reviewed.  Sinus  tachycardia, no acute ST-T wave changes noted.  Assessment/Plan  Acute gastroenteritis: -Presented with nausea, vomiting, diarrhea, abdominal pain -Lipase slightly elevated.  UA and CK pending.  Check lactic acid. -Will get CT abdomen/pelvis.  Received IV fluids in ED.  Will continue  -Continue as needed pain medication and antiemetics.  AKI: Likely due to dehydration Continue IV fluids.  Avoid NSAIDs.  Hold on hydrochlorothiazide - -monitor renal function closely.  Hypertension: Blood pressure is on lower range. -Will hold on amlodipine  and hydrochlorothiazide   History of Graves' disease: -Status post radioactive iodine ablation in the past.  Currently not on any treatment.  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  Followed by neurosurgery outpatient. -CT head negative for any acute findings.  Tobacco abuse: Counseled about cessation -Ordered nicotine  patch  GERD: Continue PPI  DVT prophylaxis: Heparin  Code Status: Full code Family Communication: None present at bedside.  Plan of care discussed with patient in length and she verbalized understanding and agreed with it. Disposition Plan: Home Consults called: None Admission status: Observation   Velna JONELLE Skeeter MD [facility]  If 7PM-7AM, please contact night-coverage [email or web address]  09/04/2023, 9:10 AM

## 2023-09-04 NOTE — ED Provider Triage Note (Addendum)
 Emergency Medicine Provider Triage Evaluation Note  Kellie Bautista , a 43 y.o. female  was evaluated in triage.  Pt complains of headache, nausea, vomiting, chest pain, and abdominal pain.  Reports feeling like she is going to pass out.  Asking for water.  Has hx of aneurysm, follows with neurosurgery, Dr. Lanis.  Review of Systems  Positive: Headache, vomiting, chest pain, abdominal pain Negative: fever  Physical Exam  BP 118/84 (BP Location: Right Arm)   Pulse (!) 127   Temp (!) 97.5 F (36.4 C)   Resp (!) 24   LMP 07/26/2023   SpO2 100%   Gen:   Awake, no distress   Resp:  Normal effort  MSK:   Moves extremities without difficulty  Other:  Moaning throughout exam, able to use arms/legs to push herself back up into chair, no focal deficits  Medical Decision Making  Medically screening exam initiated at 1:15 AM.  Appropriate orders placed.  Kellie Bautista was informed that the remainder of the evaluation will be completed by another provider, this initial triage assessment does not replace that evaluation, and the importance of remaining in the ED until their evaluation is complete.  Multiple complaints-- headache, nausea/vomiting, abd pain, chest pain.  Given hx of aneurysm, will get CTA head neck, chest x-ray, labs.  2:55 AM Labs today with AKI. Unable to get CTA.  Will get dry head CT for now, hopefully can hydrate and get CTA.   Jarold Olam HERO, PA-C 09/04/23 0124    Jarold Olam HERO, PA-C 09/04/23 [phone]

## 2023-09-04 NOTE — ED Notes (Signed)
 ED TO INPATIENT HANDOFF REPORT  ED Nurse Name and Phone #: Carlyon GLADE RN [phone]  S Name/Age/Gender Kellie Bautista 43 y.o. female Room/Bed: 003C/003C  Code Status   Code Status: Full Code  Home/SNF/Other Home Patient oriented to: self, place, time, and situation Is this baseline? Yes   Triage Complete: Triage complete  Chief Complaint AKI (acute kidney injury) (HCC) [N17.9]  Triage Note Pt with headache, chest pain, n/v that began today.  Hx of aneurysm and followed by neuro pt report.  Pt also reports abdominal pain.     Allergies Allergies  Allergen Reactions   Shrimp (Diagnostic) Nausea And Vomiting    Level of Care/Admitting Diagnosis ED Disposition     ED Disposition  Admit   Condition  --   Comment  Hospital Area: [facility] [[id number]]  Level of Care: Med-Surg [16]  May place patient in observation at [facility] or [facility] if equivalent level of care is available:: Yes  Covid Evaluation: Asymptomatic - no recent exposure (last 10 days) testing not required  Diagnosis: AKI (acute kidney injury) [facility]) [[id number]]  Admitting Physician: VERNON VELNA SAUNDERS [[id number]]  Attending Physician: VERNON VELNA SAUNDERS [phone]          B Medical/Surgery History Past Medical History:  Diagnosis Date   Graves disease    Hypertension    Past Surgical History:  Procedure Laterality Date   CESAREAN SECTION     IR 3D INDEPENDENT WKST  08/23/2023   IR ANGIO INTRA EXTRACRAN SEL INTERNAL CAROTID BILAT MOD SED  08/23/2023   IR ANGIO VERTEBRAL SEL VERTEBRAL BILAT MOD SED  08/23/2023   TUBAL LIGATION     2008     A IV Location/Drains/Wounds Patient Lines/Drains/Airways Status     Active Line/Drains/Airways     Name Placement date Placement time Site Days   Peripheral IV 09/04/23 20 G Anterior;Proximal;Right Forearm 09/04/23  0533  Forearm  less than 1            Intake/Output Last 24 hours No intake or output data in the 24 hours ending 09/04/23  1015  Labs/Imaging Results for orders placed or performed during the hospital encounter of 09/04/23 (from the past 48 hours)  CBC with Differential     Status: Abnormal   Collection Time: 09/04/23  1:28 AM  Result Value Ref Range   WBC 10.6 (H) 4.0 - 10.5 K/uL   RBC 6.38 (H) 3.87 - 5.11 MIL/uL   Hemoglobin 17.0 (H) 12.0 - 15.0 g/dL   HCT 49.6 (H) 63.9 - 53.9 %   MCV 78.8 (L) 80.0 - 100.0 fL   MCH 26.6 26.0 - 34.0 pg   MCHC 33.8 30.0 - 36.0 g/dL   RDW 85.4 88.4 - 84.4 %   Platelets 407 (H) 150 - 400 K/uL   nRBC 0.0 0.0 - 0.2 %   Neutrophils Relative % 74 %   Neutro Abs 7.9 (H) 1.7 - 7.7 K/uL   Lymphocytes Relative 20 %   Lymphs Abs 2.2 0.7 - 4.0 K/uL   Monocytes Relative 5 %   Monocytes Absolute 0.5 0.1 - 1.0 K/uL   Eosinophils Relative 0 %   Eosinophils Absolute 0.0 0.0 - 0.5 K/uL   Basophils Relative 1 %   Basophils Absolute 0.1 0.0 - 0.1 K/uL   Immature Granulocytes 0 %   Abs Immature Granulocytes 0.04 0.00 - 0.07 K/uL    Comment: Performed at [facility], [address], [place], [place] [address]  Comprehensive metabolic panel     Status: Abnormal   Collection Time: 09/04/23  1:28 AM  Result Value Ref Range   Sodium 137 135 - 145 mmol/L   Potassium 3.6 3.5 - 5.1 mmol/L   Chloride 99 98 - 111 mmol/L   CO2 20 (L) 22 - 32 mmol/L   Glucose, Bld 163 (H) 70 - 99 mg/dL    Comment: Glucose reference range applies only to samples taken after fasting for at least 8 hours.   BUN 16 6 - 20 mg/dL   Creatinine, Ser 7.65 (H) 0.44 - 1.00 mg/dL   Calcium 88.1 (H) 8.9 - 10.3 mg/dL   Total Protein 9.7 (H) 6.5 - 8.1 g/dL   Albumin 5.2 (H) 3.5 - 5.0 g/dL   AST 28 15 - 41 U/L   ALT 24 0 - 44 U/L   Alkaline Phosphatase 83 38 - 126 U/L   Total Bilirubin 0.7 0.0 - 1.2 mg/dL   GFR, Estimated 26 (L) >60 mL/min    Comment: (NOTE) Calculated using the CKD-EPI Creatinine Equation (2021)    Anion gap 18 (H) 5 - 15    Comment: Performed at [facility], [address],  [place], [place] [address]  Lipase, blood     Status: Abnormal   Collection Time: 09/04/23  1:28 AM  Result Value Ref Range   Lipase 62 (H) 11 - 51 U/L    Comment: Performed at [facility], [address], [place], [place] [address]  Troponin I (High Sensitivity)     Status: None   Collection Time: 09/04/23  1:28 AM  Result Value Ref Range   Troponin I (High Sensitivity) 4 <18 ng/L    Comment: (NOTE) Elevated high sensitivity troponin I (hsTnI) values and significant  changes across serial measurements may suggest ACS but many other  chronic and acute conditions are known to elevate hsTnI results.  Refer to the Links section for chest pain algorithms and additional  guidance. Performed at [facility], [address] [place], [place] [address]   hCG, serum, qualitative     Status: None   Collection Time: 09/04/23  3:32 AM  Result Value Ref Range   Preg, Serum NEGATIVE NEGATIVE    Comment:        THE SENSITIVITY OF THIS METHODOLOGY IS >10 mIU/mL. Performed at [facility], [address] [place], [place] [address]   Troponin I (High Sensitivity)     Status: None   Collection Time: 09/04/23  3:32 AM  Result Value Ref Range   Troponin I (High Sensitivity) 4 <18 ng/L    Comment: (NOTE) Elevated high sensitivity troponin I (hsTnI) values and significant  changes across serial measurements may suggest ACS but many other  chronic and acute conditions are known to elevate hsTnI results.  Refer to the Links section for chest pain algorithms and additional  guidance. Performed at [facility], [address], [place], [place] [address]   CK     Status: Abnormal   Collection Time: 09/04/23  3:32 AM  Result Value Ref Range   Total CK 34 (L) 38 - 234 U/L    Comment: Performed at [facility], [address], [place], [place] [address]   CT ABDOMEN PELVIS WO CONTRAST Result Date: 09/04/2023 CLINICAL DATA:  Abdominal/epigastric pain. EXAM: CT ABDOMEN AND  PELVIS WITHOUT CONTRAST TECHNIQUE: Multidetector CT imaging of the abdomen and pelvis was performed following the  standard protocol without IV contrast. RADIATION DOSE REDUCTION: This exam was performed according to the departmental dose-optimization program which includes automated exposure control, adjustment of the mA and/or kV according to patient size and/or use of iterative reconstruction technique. COMPARISON:  01/27/2023 FINDINGS: Lower chest: Heart is normal size.  Lung bases are clear. Hepatobiliary: Liver, gallbladder and biliary tree are normal. Pancreas: Normal. Spleen: Normal. Adrenals/Urinary Tract: Adrenal glands are normal. Kidneys are normal in size without hydronephrosis or nephrolithiasis. Ureters and bladder are normal. Stomach/Bowel: Stomach is normal. There are a few mid to distal small bowel loops over the right mid to lower abdomen with mild wall thickening which may be due to a regional enteritis of infectious or inflammatory nature. Appendix is normal. Colon is normal. Vascular/Lymphatic: There is mild calcified plaque over the abdominal aorta which is normal in caliber. Remaining vascular structures are unremarkable. No evidence of adenopathy. Reproductive: Uterus and bilateral adnexa are unremarkable. Other: Small amount of free fluid in the cul-de-sac likely physiologic. Musculoskeletal: No focal abnormality. IMPRESSION: 1. Few mid to distal small bowel loops over the right mid to lower abdomen with mild wall thickening which may be due to a regional enteritis of infectious or inflammatory nature. 2. Small amount of free fluid in the cul-de-sac likely physiologic. 3. Aortic atherosclerosis. Aortic Atherosclerosis (ICD10-I70.0). Electronically Signed   By: Toribio Agreste M.D.   On: 09/04/2023 09:55   CT HEAD WO CONTRAST ( ) Result Date: 09/04/2023 CLINICAL DATA:  Headache with history of aneurysm EXAM: CT HEAD WITHOUT CONTRAST TECHNIQUE: Contiguous axial images were obtained from the  base of the skull through the vertex without intravenous contrast. RADIATION DOSE REDUCTION: This exam was performed according to the departmental dose-optimization program which includes automated exposure control, adjustment of the mA and/or kV according to patient size and/or use of iterative reconstruction technique. COMPARISON:  None Available. FINDINGS: Brain: No mass,hemorrhage or extra-axial collection. Normal appearance of the parenchyma and CSF spaces. Vascular: No hyperdense vessel or unexpected vascular calcification. Skull: The visualized skull base, calvarium and extracranial soft tissues are normal. Sinuses/Orbits: No fluid levels or advanced mucosal thickening of the visualized paranasal sinuses. No mastoid or middle ear effusion. Normal orbits. Other: None. IMPRESSION: Normal head CT. Electronically Signed   By: Franky Stanford M.D.   On: 09/04/2023 03:23   DG Chest 1 View Result Date: 09/04/2023 CLINICAL DATA:  Chest pain and vomiting EXAM: CHEST  1 VIEW COMPARISON:  01/04/2009 FINDINGS: Cardiac shadow is within normal limits. Lungs are well aerated bilaterally. No focal infiltrate or effusion is noted. No bony abnormality is seen. IMPRESSION: No active disease. Electronically Signed   By: Oneil Devonshire M.D.   On: 09/04/2023 01:44    Pending Labs Unresulted Labs (From admission, onward)     Start     Ordered   09/05/23 0500  Comprehensive metabolic panel  Tomorrow morning,   R        09/04/23 0909   09/05/23 0500  CBC  Tomorrow morning,   R        09/04/23 0909   09/04/23 0928  Lactic acid, plasma  (Lactic Acid)  STAT Now then every 3 hours,   R (with STAT occurrences)      09/04/23 0927   09/04/23 0909  Magnesium  Add-on,   AD        09/04/23 0909   09/04/23 0909  TSH  Add-on,   AD        09/04/23 9090   09/04/23  0908  HIV Antibody (routine testing w rflx)  (HIV Antibody (Routine testing w reflex) panel)  Once,   R        09/04/23 0909   09/04/23 0908  CBC  (heparin )  Once,   R        Comments: Baseline for heparin  therapy IF NOT ALREADY DRAWN.  Notify MD if PLT < 100 K.    09/04/23 0909   09/04/23 0908  Creatinine, serum  (heparin )  Once,   R       Comments: Baseline for heparin  therapy IF NOT ALREADY DRAWN.    09/04/23 0909   09/04/23 0619  Creatinine, urine, random  Once,   R        09/04/23 0618   09/04/23 0618  Sodium, urine, random  Once,   R        09/04/23 0618   09/04/23 0512  Urinalysis, Routine w reflex microscopic -Urine, Clean Catch  Once,   URGENT       Question:  Specimen Source  Answer:  Urine, Clean Catch   09/04/23 0511   09/04/23 0124  Urinalysis, w/ Reflex to Culture (Infection Suspected) -Urine, Clean Catch  Once,   URGENT       Question:  Specimen Source  Answer:  Urine, Clean Catch   09/04/23 0123            Vitals/Pain Today's Vitals   09/04/23 0915 09/04/23 0930 09/04/23 0945 09/04/23 1000  BP: (!) 148/105 115/89 103/87 120/89  Pulse: 86 86 77 84  Resp: 20 16 15 16   Temp:      TempSrc:      SpO2: 99% 100% 94% 97%  PainSc:        Isolation Precautions No active isolations  Medications Medications  heparin  injection 5,000 Units (has no administration in time range)  acetaminophen  (TYLENOL ) tablet 650 mg (has no administration in time range)    Or  acetaminophen  (TYLENOL ) suppository 650 mg (has no administration in time range)  morphine  (PF) 2 MG/ML injection 2 mg (has no administration in time range)  ondansetron  (ZOFRAN ) tablet 4 mg (has no administration in time range)    Or  ondansetron  (ZOFRAN ) injection 4 mg (has no administration in time range)  hydrALAZINE  (APRESOLINE ) injection 10 mg (has no administration in time range)  pantoprazole  (PROTONIX ) EC tablet 20 mg (has no administration in time range)  nicotine  (NICODERM CQ  - dosed in mg/24 hours) patch 14 mg (has no administration in time range)  0.9 %  sodium chloride  infusion (has no administration in time range)  lactated ringers  bolus 1,000 mL (0 mLs  Intravenous Stopped 09/04/23 1014)  HYDROmorphone  (DILAUDID ) injection 1 mg (1 mg Intravenous Given 09/04/23 0541)  lactated ringers  bolus 1,000 mL (0 mLs Intravenous Stopped 09/04/23 1015)  ondansetron  (ZOFRAN ) injection 4 mg (4 mg Intravenous Given 09/04/23 0541)    Mobility walks      R Recommendations: See Admitting Provider Note  Report given to: [id number]

## 2023-09-04 NOTE — Progress Notes (Signed)
 Pt refused heparin injection educated on importance in taking  medication.

## 2023-09-04 NOTE — Progress Notes (Signed)
 Pt arrived to 6 north room 32 alert and oriented x4. Pain level 4/10 in abdomen. Bed in lowset position. Call light in reach. Will continue to monitor pt.

## 2023-09-05 DIAGNOSIS — K529 Noninfective gastroenteritis and colitis, unspecified: Secondary | ICD-10-CM | POA: Diagnosis not present

## 2023-09-05 DIAGNOSIS — E876 Hypokalemia: Secondary | ICD-10-CM | POA: Diagnosis not present

## 2023-09-05 DIAGNOSIS — F17210 Nicotine dependence, cigarettes, uncomplicated: Secondary | ICD-10-CM | POA: Diagnosis not present

## 2023-09-05 DIAGNOSIS — R0789 Other chest pain: Secondary | ICD-10-CM | POA: Diagnosis not present

## 2023-09-05 DIAGNOSIS — Z8249 Family history of ischemic heart disease and other diseases of the circulatory system: Secondary | ICD-10-CM | POA: Diagnosis not present

## 2023-09-05 DIAGNOSIS — R079 Chest pain, unspecified: Secondary | ICD-10-CM | POA: Diagnosis not present

## 2023-09-05 DIAGNOSIS — Z91013 Allergy to seafood: Secondary | ICD-10-CM | POA: Diagnosis not present

## 2023-09-05 DIAGNOSIS — R519 Headache, unspecified: Secondary | ICD-10-CM | POA: Diagnosis not present

## 2023-09-05 DIAGNOSIS — Z716 Tobacco abuse counseling: Secondary | ICD-10-CM | POA: Diagnosis not present

## 2023-09-05 DIAGNOSIS — E860 Dehydration: Secondary | ICD-10-CM | POA: Diagnosis not present

## 2023-09-05 DIAGNOSIS — I10 Essential (primary) hypertension: Secondary | ICD-10-CM | POA: Diagnosis not present

## 2023-09-05 DIAGNOSIS — R252 Cramp and spasm: Secondary | ICD-10-CM | POA: Diagnosis not present

## 2023-09-05 DIAGNOSIS — N179 Acute kidney failure, unspecified: Secondary | ICD-10-CM | POA: Diagnosis not present

## 2023-09-05 DIAGNOSIS — E039 Hypothyroidism, unspecified: Secondary | ICD-10-CM | POA: Diagnosis not present

## 2023-09-05 DIAGNOSIS — R1110 Vomiting, unspecified: Secondary | ICD-10-CM | POA: Diagnosis not present

## 2023-09-05 DIAGNOSIS — I671 Cerebral aneurysm, nonruptured: Secondary | ICD-10-CM | POA: Diagnosis not present

## 2023-09-05 DIAGNOSIS — Z79899 Other long term (current) drug therapy: Secondary | ICD-10-CM | POA: Diagnosis not present

## 2023-09-05 LAB — COMPREHENSIVE METABOLIC PANEL
ALT: 17 U/L (ref 0–44)
AST: 20 U/L (ref 15–41)
Albumin: 3.5 g/dL (ref 3.5–5.0)
Alkaline Phosphatase: 53 U/L (ref 38–126)
Anion gap: 11 (ref 5–15)
BUN: 18 mg/dL (ref 6–20)
CO2: 24 mmol/L (ref 22–32)
Calcium: 8.8 mg/dL — ABNORMAL LOW (ref 8.9–10.3)
Chloride: 102 mmol/L (ref 98–111)
Creatinine, Ser: 1.17 mg/dL — ABNORMAL HIGH (ref 0.44–1.00)
GFR, Estimated: 59 mL/min — ABNORMAL LOW (ref >60)
Glucose, Bld: 127 mg/dL — ABNORMAL HIGH (ref 70–99)
Potassium: 2.8 mmol/L — ABNORMAL LOW (ref 3.5–5.1)
Sodium: 137 mmol/L (ref 135–145)
Total Bilirubin: 0.7 mg/dL (ref 0.0–1.2)
Total Protein: 6.7 g/dL (ref 6.5–8.1)

## 2023-09-05 LAB — CBC
HCT: 39.3 % (ref 36.0–46.0)
Hemoglobin: 13.1 g/dL (ref 12.0–15.0)
MCH: 26.7 pg (ref 26.0–34.0)
MCHC: 33.3 g/dL (ref 30.0–36.0)
MCV: 80.2 fL (ref 80.0–100.0)
Platelets: 306 10*3/uL (ref 150–400)
RBC: 4.9 MIL/uL (ref 3.87–5.11)
RDW: 14.2 % (ref 11.5–15.5)
WBC: 10.9 10*3/uL — ABNORMAL HIGH (ref 4.0–10.5)
nRBC: 0 % (ref 0.0–0.2)

## 2023-09-05 MED ORDER — OXYCODONE HCL 5 MG PO TABS
5.0000 mg | ORAL_TABLET | ORAL | Status: DC | PRN
  Administered 2023-09-05 – 2023-09-06 (×3): 5 mg via ORAL
  Filled 2023-09-05 (×3): qty 1

## 2023-09-05 MED ORDER — KCL IN DEXTROSE-NACL 20-5-0.45 MEQ/L-%-% IV SOLN
INTRAVENOUS | Status: AC
  Filled 2023-09-05: qty 1000

## 2023-09-05 MED ORDER — POTASSIUM CHLORIDE CRYS ER 20 MEQ PO TBCR
40.0000 meq | EXTENDED_RELEASE_TABLET | ORAL | Status: AC
  Administered 2023-09-05 (×2): 40 meq via ORAL
  Filled 2023-09-05 (×2): qty 2

## 2023-09-05 MED ORDER — MORPHINE SULFATE (PF) 2 MG/ML IV SOLN
2.0000 mg | INTRAVENOUS | Status: DC | PRN
  Administered 2023-09-05: 2 mg via INTRAVENOUS
  Filled 2023-09-05: qty 1

## 2023-09-05 NOTE — Plan of Care (Signed)
  Problem: Education: Goal: Knowledge of General Education information will improve Description: Including pain rating scale, medication(s)/side effects and non-pharmacologic comfort measures Outcome: Progressing   Problem: Clinical Measurements: Goal: Will remain free from infection Outcome: Progressing Goal: Diagnostic test results will improve Outcome: Progressing Goal: Respiratory complications will improve Outcome: Progressing   Problem: Activity: Goal: Risk for activity intolerance will decrease Outcome: Progressing   Problem: Coping: Goal: Level of anxiety will decrease Outcome: Progressing   Problem: Elimination: Goal: Will not experience complications related to bowel motility Outcome: Progressing   Problem: Pain Management: Goal: General experience of comfort will improve Outcome: Progressing   Problem: Safety: Goal: Ability to remain free from injury will improve Outcome: Progressing

## 2023-09-05 NOTE — Progress Notes (Signed)
 Pt stating that she is having increased pain in her abdomen and is requesting MD to room. Grunz,MD made aware. Morphine 2mg  given. Also requested oxycodone for breakthrough pain

## 2023-09-05 NOTE — Progress Notes (Signed)
 [facility] PROGRESS NOTE  Charl Preece (DOB: 27-Nov-1980) FMW:[id number] PCP: Tanda Bleacher, MD  Brief Narrative: Ahley Bautista is a 43 y.o. female with a history of HTN, tobacco use, Graves' disease s/p ablation, intracranial aneurysms who presented to the ED on 09/04/2023 with nausea, vomiting, diarrhea and abdominal cramping. She appeared dehydrated, tachycardic, afebrile, tachypneic with leukocytosis (WBC 10.6k) as well as AKI (SCr 2.34 from normal baseline).   Subjective: Wanted to go home this morning but hasn't tolerated more than clear liquids due to crampy abdominal pain that waxes and wanes, improved with morphine . No headache currently. She rates pain as very severe. Slightly better than on admission, but limiting her considerably.   Objective: BP 108/77 (BP Location: Right Arm)   Pulse 73   Temp (!) 97.5 F (36.4 C) (Oral)   Resp 17   Ht 5' 4 (1.626 m)   Wt 84.3 kg   LMP 07/26/2023   SpO2 99%   BMI 31.91 kg/m   Gen: 42yo F in some discomfort evident Pulm: Clear, nonlabored  CV: RRR, no MRG  GI: Soft, minimally diffusely tender without rebound or guarding, hyperactive bowel sounds, no distention. Neuro: Alert and oriented. No new focal deficits. Ext: Warm, no deformities. Skin: No rashes, lesions or ulcers on visualized skin   Assessment & Plan: Enteritis: Abdominal pain, nausea, vomiting, diarrhea: CT showed possible enteritis without other acute findings. - Pt states symptoms are not worse with po intake. Though lipase was mildly elevated (62 with ULN 51), no inflammation noted on CT and exam not suggestive of pancreatitis. LFTs all normal. Will advance diet somewhat, support with oxycodone  prn pain (or IV morphine  if she has recurrent vomiting) and IV zofran  prn vomiting. Requires ongoing IV medications and fluids given her inadequate intake and uncontrolled symptoms.    AKI due to dehydration and GI losses: Ongoing inadequate po intake, though with IVF Cr is  improving. 2.3 >> 1.17 though baseline is 0.7. CK not elevated. Lactic acid normal x2. UA bland.  - Continue IVF and advance diet as tolerated.  - Avoid nephrotoxins, monitor metabolic panel in AM  Hypokalemia: Due to ongoing GI losses and no po intake.  - Supplement in IVF and monitor. Mag was 2.1.   HTN: Normotensive currently.  - Low threshold to restart norvasc  if BP rises, though is normotensive in setting of prerenal azotemia.  - Holding HCTZ due to AKI and hypokalemia currently.  Hypothyroidism: TSH is 2.218. Not on Tx.  Intracranial aneurysms: ~9mm paraclinoid and 2.51mm PCA left-sided aneurysms noted on cerebral angiogram 12/20 by Dr. Lanis. CT head done this admit due to headache showed no hemorrhage. - BP management  Tobacco use: 1/2ppd.  - Cessation counseling - Nicotine  patch  Bernardino KATHEE Come, MD [facility] [email or web address] 09/05/2023, 11:32 AM

## 2023-09-05 NOTE — Care Management (Signed)
  Transition of Care [facility]) Screening Note   Patient Details  Name: Kellie Bautista Date of Birth: [birth year]   Transition of Care [facility]) CM/SW Contact:    Corean JAYSON Canary, RN Phone Number: 09/05/2023, 2:52 PM    Transition of Care Department [facility]) has reviewed patient and no TOC needs have been identified at this time. We will continue to monitor patient advancement through interdisciplinary progression rounds. If new patient transition needs arise, please place a TOC consult.

## 2023-09-06 DIAGNOSIS — N179 Acute kidney failure, unspecified: Secondary | ICD-10-CM | POA: Diagnosis not present

## 2023-09-06 LAB — BASIC METABOLIC PANEL
Anion gap: 9 (ref 5–15)
BUN: 7 mg/dL (ref 6–20)
CO2: 23 mmol/L (ref 22–32)
Calcium: 8.5 mg/dL — ABNORMAL LOW (ref 8.9–10.3)
Chloride: 106 mmol/L (ref 98–111)
Creatinine, Ser: 0.74 mg/dL (ref 0.44–1.00)
GFR, Estimated: >60 mL/min (ref >60)
Glucose, Bld: 88 mg/dL (ref 70–99)
Potassium: 3.5 mmol/L (ref 3.5–5.1)
Sodium: 138 mmol/L (ref 135–145)

## 2023-09-06 MED ORDER — ONDANSETRON HCL 4 MG PO TABS: 4.0000 mg | ORAL_TABLET | Freq: Three times a day (TID) | ORAL | 0 refills | Status: AC | PRN

## 2023-09-06 MED ORDER — OXYCODONE HCL 5 MG PO TABS: 5.0000 mg | ORAL_TABLET | Freq: Four times a day (QID) | ORAL | 0 refills | Status: AC | PRN

## 2023-09-06 NOTE — Progress Notes (Signed)
 Discharge instructions given to pt. Pt verbalized understanding of all teaching and had no further questions. Work note given to pt prior to discharge. Pt currently in room waiting for husband to pick her up

## 2023-09-06 NOTE — Progress Notes (Signed)
 Pt discharged to home with husband with all belongings and paperwork

## 2023-09-06 NOTE — Discharge Summary (Signed)
 Physician Discharge Summary   Patient: Kellie Bautista MRN: [id number] DOB: [birth year]  Admit date:     09/04/2023  Discharge date: 09/06/23  Discharge Physician: Bernardino KATHEE Come   PCP: Tanda Bleacher, MD   Recommendations at discharge:  Follow up with PCP in 1-2 weeks with attention toward rigid BP control given her intracranial aneurysms.  Continue tobacco cessation counseling, consider medication assistance.   Discharge Diagnoses: Principal Problem:   AKI (acute kidney injury) [facility])  Hospital Course: Kellie Bautista is a 43 y.o. female with a history of HTN, tobacco use, Graves' disease s/p ablation, intracranial aneurysms who presented to the ED on 09/04/2023 with nausea, vomiting, diarrhea and abdominal cramping. She appeared dehydrated, tachycardic, afebrile, tachypneic with leukocytosis (WBC 10.6k) as well as AKI (SCr 2.34 from normal baseline). CT was reassuring, possibly showing enteritis which was treated supportively with IVF, antiemetics, and analgesic. She is tolerating a diet and requesting discharge.   Assessment and Plan: Enteritis: Abdominal pain, nausea, vomiting, diarrhea: CT showed possible enteritis without other acute findings. Pt states symptoms are not worse with po intake. Though lipase was mildly elevated (62 with ULN 51), no inflammation noted on CT and exam not suggestive of pancreatitis. LFTs all normal.  - Clinical course shows improvement as expected. Able to tolerate po and can be discharged with oral medications for pain and nausea with return precautions.     AKI due to dehydration and GI losses: Cr has normalized 2.3 >> baseline at 0.74. CK not elevated. Lactic acid normal x2. UA bland.    Hypokalemia: Due to GI losses and limited po intake, both of which have resolved. K normalized to 3.5 on day of discharge.    HTN: Normotensive currently. Continue home Tx.    Hypothyroidism: TSH is 2.218. Not on Tx.   Intracranial aneurysms: ~82mm paraclinoid and 2.71mm PCA  left-sided aneurysms noted on cerebral angiogram 12/20 by Dr. Lanis. CT head done this admit due to headache showed no hemorrhage. - BP management   Tobacco use: 1/2ppd.  - Cessation counseling - Nicotine  patch  Consultants: None Procedures performed: None  Disposition: Home Diet recommendation: Heart healthy, ADAT DISCHARGE MEDICATION: Allergies as of 09/06/2023       Reactions   Shrimp (diagnostic) Nausea And Vomiting        Medication List     TAKE these medications    amLODipine  10 MG tablet Commonly known as: NORVASC  Take 1 tablet (10 mg total) by mouth daily.   hydrochlorothiazide  25 MG tablet Commonly known as: HYDRODIURIL  Take 1 tablet (25 mg total) by mouth daily.   ondansetron  4 MG tablet Commonly known as: Zofran  Take 1 tablet (4 mg total) by mouth every 8 (eight) hours as needed for nausea or vomiting.   oxyCODONE  5 MG immediate release tablet Commonly known as: Oxy IR/ROXICODONE  Take 1 tablet (5 mg total) by mouth every 6 (six) hours as needed for moderate pain (pain score 4-6) or severe pain (pain score 7-10).   pantoprazole  20 MG tablet Commonly known as: PROTONIX  Take 1 tablet (20 mg total) by mouth daily.        Discharge Exam: Filed Weights   09/04/23 1624  Weight: 84.3 kg  BP 122/83 (BP Location: Right Arm)   Pulse 79   Temp 98.2 F (36.8 C)   Resp 18   Ht 5' 4 (1.626 m)   Wt 84.3 kg   LMP 07/26/2023   SpO2 100%   BMI 31.91 kg/m   No distress, pleasant,  alert and oriented speaking with daughter by facetime.  RRR, no MRG Clear, nonlabored Modestly tender diffusely, nondistended, no rebound, +BS  Condition at discharge: stable  The results of significant diagnostics from this hospitalization (including imaging, microbiology, ancillary and laboratory) are listed below for reference.   Imaging Studies: CT ABDOMEN PELVIS WO CONTRAST Result Date: 09/04/2023 CLINICAL DATA:  Abdominal/epigastric pain. EXAM: CT ABDOMEN AND PELVIS  WITHOUT CONTRAST TECHNIQUE: Multidetector CT imaging of the abdomen and pelvis was performed following the standard protocol without IV contrast. RADIATION DOSE REDUCTION: This exam was performed according to the departmental dose-optimization program which includes automated exposure control, adjustment of the mA and/or kV according to patient size and/or use of iterative reconstruction technique. COMPARISON:  01/27/2023 FINDINGS: Lower chest: Heart is normal size.  Lung bases are clear. Hepatobiliary: Liver, gallbladder and biliary tree are normal. Pancreas: Normal. Spleen: Normal. Adrenals/Urinary Tract: Adrenal glands are normal. Kidneys are normal in size without hydronephrosis or nephrolithiasis. Ureters and bladder are normal. Stomach/Bowel: Stomach is normal. There are a few mid to distal small bowel loops over the right mid to lower abdomen with mild wall thickening which may be due to a regional enteritis of infectious or inflammatory nature. Appendix is normal. Colon is normal. Vascular/Lymphatic: There is mild calcified plaque over the abdominal aorta which is normal in caliber. Remaining vascular structures are unremarkable. No evidence of adenopathy. Reproductive: Uterus and bilateral adnexa are unremarkable. Other: Small amount of free fluid in the cul-de-sac likely physiologic. Musculoskeletal: No focal abnormality. IMPRESSION: 1. Few mid to distal small bowel loops over the right mid to lower abdomen with mild wall thickening which may be due to a regional enteritis of infectious or inflammatory nature. 2. Small amount of free fluid in the cul-de-sac likely physiologic. 3. Aortic atherosclerosis. Aortic Atherosclerosis (ICD10-I70.0). Electronically Signed   By: Toribio Agreste M.D.   On: 09/04/2023 09:55   CT HEAD WO CONTRAST ( ) Result Date: 09/04/2023 CLINICAL DATA:  Headache with history of aneurysm EXAM: CT HEAD WITHOUT CONTRAST TECHNIQUE: Contiguous axial images were obtained from the base of  the skull through the vertex without intravenous contrast. RADIATION DOSE REDUCTION: This exam was performed according to the departmental dose-optimization program which includes automated exposure control, adjustment of the mA and/or kV according to patient size and/or use of iterative reconstruction technique. COMPARISON:  None Available. FINDINGS: Brain: No mass,hemorrhage or extra-axial collection. Normal appearance of the parenchyma and CSF spaces. Vascular: No hyperdense vessel or unexpected vascular calcification. Skull: The visualized skull base, calvarium and extracranial soft tissues are normal. Sinuses/Orbits: No fluid levels or advanced mucosal thickening of the visualized paranasal sinuses. No mastoid or middle ear effusion. Normal orbits. Other: None. IMPRESSION: Normal head CT. Electronically Signed   By: Franky Stanford M.D.   On: 09/04/2023 03:23   DG Chest 1 View Result Date: 09/04/2023 CLINICAL DATA:  Chest pain and vomiting EXAM: CHEST  1 VIEW COMPARISON:  01/04/2009 FINDINGS: Cardiac shadow is within normal limits. Lungs are well aerated bilaterally. No focal infiltrate or effusion is noted. No bony abnormality is seen. IMPRESSION: No active disease. Electronically Signed   By: Oneil Devonshire M.D.   On: 09/04/2023 01:44   IR ANGIO VERTEBRAL SEL VERTEBRAL BILAT MOD SED Result Date: 08/23/2023 PROCEDURE: DIAGNOSTIC CEREBRAL ANGIOGRAM HISTORY: The patient is a 43 year old woman with history of poorly controlled hypertension who presented to the emergency department with hypertensive urgency. CT and CT angiogram were completed. While she did not have any evidence of subarachnoid  hemorrhage, there was incidental discovery of possible left-sided carotid aneurysms. She was seen in the Outpatient neurosurgery clinic and presents today for further workup with diagnostic cerebral angiogram. ACCESS: The technical aspects of the procedure as well as its potential risks and benefits were reviewed with the  patient. These risks included but were not limited bleeding, infection, allergic reaction, damage to organs or vital structures, stroke, non-diagnostic procedure, and the catastrophic outcomes of heart attack, coma, and death. With an understanding of these risks, informed consent was obtained and witnessed. The patient was placed in the supine position on the angiography table and the skin of right groin prepped in the usual sterile fashion. The procedure was performed under local anesthesia (1%-solution of bicarbonate-buffered Lidocaine ) and conscious sedation with 1mg  versed  and fentanyl  monitored by myself and the in-suite nurse using continuous pulse-oximetry, heart rate, and non-invasive blood-pressure. A 5- French sheath was introduced in the right common femoral artery using Seldinger technique. A fluoro-phase sequence was used to document the sheath position. MEDICATIONS: HEPARIN : 2000 Units total. CONTRAST:  cc, Omnipaque  300 FLUOROSCOPY TIME:  FLUOROSCOPY TIME: See IR records TECHNIQUE: CATHETERS AND WIRES 5-French JB-1 catheter 0.035 glidewire VESSELS CATHETERIZED Right internal carotid Left internal carotid Left vertebral Right common femoral VESSELS STUDIED Right internal carotid, head Left internal carotid, head Left internal carotid, 3D rotational angiogram Left vertebral Right vertebral Right femoral PROCEDURAL NARRATIVE A 5-Fr JB-1 catheter was advanced over a 0.035 glidewire into the aortic arch. The above vessels were then sequentially catheterized and cervical / cerebral angiograms taken. After review of images, the catheter was removed without incident. FINDINGS: Right internal carotid, head: Injection reveals the presence of a widely patent ICA, M1, and A1 segments and their branches. No aneurysms, AVMs, or high-flow fistulas are seen. The parenchymal and venous phases are normal. The venous sinuses are widely patent. Left internal carotid, head: Injection reveals the presence of  a widely patent ICA, A1, and M1 segments and their branches. There appears to be a medially projecting aneurysms arising from the paraclinoid segment of the left internal carotid. Furthermore, there does appear to be a small aneurysms arising from the supraclinoid carotid. These aneurysms are better delineated on the three-dimensional rotational angiogram. The parenchymal and venous phases are normal. The venous sinuses are widely patent. Left internal carotid, 3D rotation 3-dimensional rotational angiographic images were reconstructed on an independent workstation for review. These further delineate the above-described aneurysms. Specifically, there is a proximally 4.3 x 3 mm aneurysms arising adjacent to the ophthalmic artery, projecting slightly medially. In addition, there is a small aneurysms at the origin of the posterior communicating artery, measuring approximately 2.4 x 2.0 mm. The posterior communicating artery arises from the neck of the aneurysm. Left vertebral: Injection reveals the presence of a widely patent vertebral artery. This leads to a widely patent basilar artery that terminates in bilateral P1. The basilar apex is normal. No aneurysms, AVMs, or high-flow fistulas are seen. The parenchymal and venous phases are normal. The venous sinuses are widely patent. Right femoral: Normal vessel. No significant atherosclerotic disease. Arterial sheath in adequate position. DISPOSITION: Upon completion of the study, the femoral sheath was removed and hemostasis obtained using a 6-Fr Angio-Seal closure device. Good proximal and distal lower extremity pulses were documented upon achievement of hemostasis. The procedure was well tolerated and no early complications were observed. The patient was transferred to the holding area to lay flat for 2 hours. IMPRESSION: 1. 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 The preliminary results  of this procedure were shared with the patient and the patient's family. Electronically Signed   By: Gerldine Maizes   On: 08/23/2023 11:08   IR ANGIO INTRA EXTRACRAN SEL INTERNAL CAROTID BILAT MOD SED Result Date: 08/23/2023 PROCEDURE: DIAGNOSTIC CEREBRAL ANGIOGRAM HISTORY: The patient is a 43 year old woman with history of poorly controlled hypertension who presented to the emergency department with hypertensive urgency. CT and CT angiogram were completed. While she did not have any evidence of subarachnoid hemorrhage, there was incidental discovery of possible left-sided carotid aneurysms. She was seen in the Outpatient neurosurgery clinic and presents today for further workup with diagnostic cerebral angiogram. ACCESS: The technical aspects of the procedure as well as its potential risks and benefits were reviewed with the patient. These risks included but were not limited bleeding, infection, allergic reaction, damage to organs or vital structures, stroke, non-diagnostic procedure, and the catastrophic outcomes of heart attack, coma, and death. With an understanding of these risks, informed consent was obtained and witnessed. The patient was placed in the supine position on the angiography table and the skin of right groin prepped in the usual sterile fashion. The procedure was performed under local anesthesia (1%-solution of bicarbonate-buffered Lidocaine ) and conscious sedation with 1mg  versed  and fentanyl  monitored by myself and the in-suite nurse using continuous pulse-oximetry, heart rate, and non-invasive blood-pressure. A 5- French sheath was introduced in the right common femoral artery using Seldinger technique. A fluoro-phase sequence was used to document the sheath position. MEDICATIONS: HEPARIN : 2000 Units total. CONTRAST:  cc, Omnipaque  300 FLUOROSCOPY TIME:  FLUOROSCOPY TIME: See IR records TECHNIQUE: CATHETERS AND WIRES 5-French JB-1 catheter 0.035 glidewire VESSELS  CATHETERIZED Right internal carotid Left internal carotid Left vertebral Right common femoral VESSELS STUDIED Right internal carotid, head Left internal carotid, head Left internal carotid, 3D rotational angiogram Left vertebral Right vertebral Right femoral PROCEDURAL NARRATIVE A 5-Fr JB-1 catheter was advanced over a 0.035 glidewire into the aortic arch. The above vessels were then sequentially catheterized and cervical / cerebral angiograms taken. After review of images, the catheter was removed without incident. FINDINGS: Right internal carotid, head: Injection reveals the presence of a widely patent ICA, M1, and A1 segments and their branches. No aneurysms, AVMs, or high-flow fistulas are seen. The parenchymal and venous phases are normal. The venous sinuses are widely patent. Left internal carotid, head: Injection reveals the presence of a widely patent ICA, A1, and M1 segments and their branches. There appears to be a medially projecting aneurysms arising from the paraclinoid segment of the left internal carotid. Furthermore, there does appear to be a small aneurysms arising from the supraclinoid carotid. These aneurysms are better delineated on the three-dimensional rotational angiogram. The parenchymal and venous phases are normal. The venous sinuses are widely patent. Left internal carotid, 3D rotation 3-dimensional rotational angiographic images were reconstructed on an independent workstation for review. These further delineate the above-described aneurysms. Specifically, there is a proximally 4.3 x 3 mm aneurysms arising adjacent to the ophthalmic artery, projecting slightly medially. In addition, there is a small aneurysms at the origin of the posterior communicating artery, measuring approximately 2.4 x 2.0 mm. The posterior communicating artery arises from the neck of the aneurysm. Left vertebral: Injection reveals the presence of a widely patent vertebral artery. This leads to a widely patent  basilar artery that terminates in bilateral P1. The basilar apex is normal. No aneurysms, AVMs, or high-flow fistulas are  seen. The parenchymal and venous phases are normal. The venous sinuses are widely patent. Right femoral: Normal vessel. No significant atherosclerotic disease. Arterial sheath in adequate position. DISPOSITION: Upon completion of the study, the femoral sheath was removed and hemostasis obtained using a 6-Fr Angio-Seal closure device. Good proximal and distal lower extremity pulses were documented upon achievement of hemostasis. The procedure was well tolerated and no early complications were observed. The patient was transferred to the holding area to lay flat for 2 hours. IMPRESSION: 1. Approximately 4 mm paraclinoid and 2.4 mm posterior communicating artery aneurysms on the left side, as described above. The preliminary results of this procedure were shared with the patient and the patient's family. Electronically Signed   By: Gerldine Maizes   On: 08/23/2023 11:08   IR 3D Independent [place] Result Date: 08/23/2023 PROCEDURE: DIAGNOSTIC CEREBRAL ANGIOGRAM HISTORY: The patient is a 43 year old woman with history of poorly controlled hypertension who presented to the emergency department with hypertensive urgency. CT and CT angiogram were completed. While she did not have any evidence of subarachnoid hemorrhage, there was incidental discovery of possible left-sided carotid aneurysms. She was seen in the Outpatient neurosurgery clinic and presents today for further workup with diagnostic cerebral angiogram. ACCESS: The technical aspects of the procedure as well as its potential risks and benefits were reviewed with the patient. These risks included but were not limited bleeding, infection, allergic reaction, damage to organs or vital structures, stroke, non-diagnostic procedure, and the catastrophic outcomes of heart attack, coma, and death. With an understanding of these risks, informed  consent was obtained and witnessed. The patient was placed in the supine position on the angiography table and the skin of right groin prepped in the usual sterile fashion. The procedure was performed under local anesthesia (1%-solution of bicarbonate-buffered Lidocaine ) and conscious sedation with 1mg  versed  and fentanyl  monitored by myself and the in-suite nurse using continuous pulse-oximetry, heart rate, and non-invasive blood-pressure. A 5- French sheath was introduced in the right common femoral artery using Seldinger technique. A fluoro-phase sequence was used to document the sheath position. MEDICATIONS: HEPARIN : 2000 Units total. CONTRAST:  cc, Omnipaque  300 FLUOROSCOPY TIME:  FLUOROSCOPY TIME: See IR records TECHNIQUE: CATHETERS AND WIRES 5-French JB-1 catheter 0.035 glidewire VESSELS CATHETERIZED Right internal carotid Left internal carotid Left vertebral Right common femoral VESSELS STUDIED Right internal carotid, head Left internal carotid, head Left internal carotid, 3D rotational angiogram Left vertebral Right vertebral Right femoral PROCEDURAL NARRATIVE A 5-Fr JB-1 catheter was advanced over a 0.035 glidewire into the aortic arch. The above vessels were then sequentially catheterized and cervical / cerebral angiograms taken. After review of images, the catheter was removed without incident. FINDINGS: Right internal carotid, head: Injection reveals the presence of a widely patent ICA, M1, and A1 segments and their branches. No aneurysms, AVMs, or high-flow fistulas are seen. The parenchymal and venous phases are normal. The venous sinuses are widely patent. Left internal carotid, head: Injection reveals the presence of a widely patent ICA, A1, and M1 segments and their branches. There appears to be a medially projecting aneurysms arising from the paraclinoid segment of the left internal carotid. Furthermore, there does appear to be a small aneurysms arising from the supraclinoid carotid.  These aneurysms are better delineated on the three-dimensional rotational angiogram. The parenchymal and venous phases are normal. The venous sinuses are widely patent. Left internal carotid, 3D rotation 3-dimensional rotational angiographic images were reconstructed on an independent workstation for review. These further delineate the above-described aneurysms.  Specifically, there is a proximally 4.3 x 3 mm aneurysms arising adjacent to the ophthalmic artery, projecting slightly medially. In addition, there is a small aneurysms at the origin of the posterior communicating artery, measuring approximately 2.4 x 2.0 mm. The posterior communicating artery arises from the neck of the aneurysm. Left vertebral: Injection reveals the presence of a widely patent vertebral artery. This leads to a widely patent basilar artery that terminates in bilateral P1. The basilar apex is normal. No aneurysms, AVMs, or high-flow fistulas are seen. The parenchymal and venous phases are normal. The venous sinuses are widely patent. Right femoral: Normal vessel. No significant atherosclerotic disease. Arterial sheath in adequate position. DISPOSITION: Upon completion of the study, the femoral sheath was removed and hemostasis obtained using a 6-Fr Angio-Seal closure device. Good proximal and distal lower extremity pulses were documented upon achievement of hemostasis. The procedure was well tolerated and no early complications were observed. The patient was transferred to the holding area to lay flat for 2 hours. IMPRESSION: 1. Approximately 4 mm paraclinoid and 2.4 mm posterior communicating artery aneurysms on the left side, as described above. The preliminary results of this procedure were shared with the patient and the patient's family. Electronically Signed   By: Gerldine Maizes   On: 08/23/2023 11:08    Microbiology: Results for orders placed or performed during the hospital encounter of 07/14/21  Group A Strep by PCR      Status: None   Collection Time: 07/14/21  9:17 AM   Specimen: Throat; Sterile Swab  Result Value Ref Range Status   Group A Strep by PCR NOT DETECTED NOT DETECTED Final    Comment: Performed at [facility], [address] [place], [place] [address]  Resp Panel by RT-PCR (Flu A&B, Covid) Nasopharyngeal Swab     Status: None   Collection Time: 07/14/21  9:17 AM   Specimen: Nasopharyngeal Swab; Nasopharyngeal(NP) swabs in vial transport medium  Result Value Ref Range Status   SARS Coronavirus 2 by RT PCR NEGATIVE NEGATIVE Final    Comment: (NOTE) SARS-CoV-2 target nucleic acids are NOT DETECTED.  The SARS-CoV-2 RNA is generally detectable in upper respiratory specimens during the acute phase of infection. The lowest concentration of SARS-CoV-2 viral copies this assay can detect is 138 copies/mL. A negative result does not preclude SARS-Cov-2 infection and should not be used as the sole basis for treatment or other patient management decisions. A negative result may occur with  improper specimen collection/handling, submission of specimen other than nasopharyngeal swab, presence of viral mutation(s) within the areas targeted by this assay, and inadequate number of viral copies(<138 copies/mL). A negative result must be combined with clinical observations, patient history, and epidemiological information. The expected result is Negative.  Fact Sheet for Patients:  bloggercourse.com  Fact Sheet for Healthcare Providers:  seriousbroker.it  This test is no t yet approved or cleared by the [place]  FDA and  has been authorized for detection and/or diagnosis of SARS-CoV-2 by FDA under an Emergency Use Authorization (EUA). This EUA will remain  in effect (meaning this test can be used) for the duration of the COVID-19 declaration under Section 564(b)(1) of the Act, 21 U.S.C.section 360bbb-3(b)(1), unless the authorization is  terminated  or revoked sooner.       Influenza A by PCR NEGATIVE NEGATIVE Final   Influenza B by PCR NEGATIVE NEGATIVE Final    Comment: (NOTE) The Xpert Xpress SARS-CoV-2/FLU/RSV plus assay is intended as an aid in the  diagnosis of influenza from Nasopharyngeal swab specimens and should not be used as a sole basis for treatment. Nasal washings and aspirates are unacceptable for Xpert Xpress SARS-CoV-2/FLU/RSV testing.  Fact Sheet for Patients: bloggercourse.com  Fact Sheet for Healthcare Providers: seriousbroker.it  This test is not yet approved or cleared by the [place]  FDA and has been authorized for detection and/or diagnosis of SARS-CoV-2 by FDA under an Emergency Use Authorization (EUA). This EUA will remain in effect (meaning this test can be used) for the duration of the COVID-19 declaration under Section 564(b)(1) of the Act, 21 U.S.C. section 360bbb-3(b)(1), unless the authorization is terminated or revoked.  Performed at [facility], [address], [place], [place] [address]     Labs: CBC: Recent Labs  Lab 09/04/23 0128 09/04/23 1039 09/05/23 0219  WBC 10.6* 15.8* 10.9*  NEUTROABS 7.9*  --   --   HGB 17.0* 14.8 13.1  HCT 50.3* 44.1 39.3  MCV 78.8* 79.3* 80.2  PLT 407* 329 306   Basic Metabolic Panel: Recent Labs  Lab 09/04/23 0128 09/04/23 1039 09/05/23 0219 09/06/23 0557  NA 137  --  137 138  K 3.6  --  2.8* 3.5  CL 99  --  102 106  CO2 20*  --  24 23  GLUCOSE 163*  --  127* 88  BUN 16  --  18 7  CREATININE 2.34* 1.90* 1.17* 0.74  CALCIUM 11.8*  --  8.8* 8.5*  MG  --  2.1  --   --    Liver Function Tests: Recent Labs  Lab 09/04/23 0128 09/05/23 0219  AST 28 20  ALT 24 17  ALKPHOS 83 53  BILITOT 0.7 0.7  PROT 9.7* 6.7  ALBUMIN 5.2* 3.5   CBG: No results for input(s): GLUCAP in the last 168 hours.  Discharge time spent: greater than 30 minutes.  Signed: Bernardino KATHEE Come, MD [facility] 09/06/2023

## 2023-09-09 ENCOUNTER — Telehealth: Payer: Self-pay

## 2023-09-09 ENCOUNTER — Encounter (HOSPITAL_BASED_OUTPATIENT_CLINIC_OR_DEPARTMENT_OTHER): Payer: Self-pay | Admitting: Emergency Medicine

## 2023-09-09 ENCOUNTER — Emergency Department (HOSPITAL_BASED_OUTPATIENT_CLINIC_OR_DEPARTMENT_OTHER): Payer: Medicaid Other

## 2023-09-09 ENCOUNTER — Emergency Department (HOSPITAL_BASED_OUTPATIENT_CLINIC_OR_DEPARTMENT_OTHER)
Admission: EM | Admit: 2023-09-09 | Discharge: 2023-09-09 | Disposition: A | Discharge status: Home / Self Care | Payer: Medicaid Other | Attending: Emergency Medicine | Admitting: Emergency Medicine

## 2023-09-09 DIAGNOSIS — K5900 Constipation, unspecified: Secondary | ICD-10-CM | POA: Diagnosis not present

## 2023-09-09 DIAGNOSIS — R1013 Epigastric pain: Secondary | ICD-10-CM

## 2023-09-09 DIAGNOSIS — R109 Unspecified abdominal pain: Secondary | ICD-10-CM | POA: Diagnosis not present

## 2023-09-09 DIAGNOSIS — R110 Nausea: Secondary | ICD-10-CM | POA: Insufficient documentation

## 2023-09-09 LAB — URINALYSIS, ROUTINE W REFLEX MICROSCOPIC
Bilirubin Urine: NEGATIVE
Glucose, UA: NEGATIVE mg/dL
Hgb urine dipstick: NEGATIVE
Ketones, ur: NEGATIVE mg/dL
Leukocytes,Ua: NEGATIVE
Nitrite: NEGATIVE
Protein, ur: NEGATIVE mg/dL
Specific Gravity, Urine: 1.015 (ref 1.005–1.030)
pH: 7 (ref 5.0–8.0)

## 2023-09-09 LAB — CBC
HCT: 40.8 % (ref 36.0–46.0)
Hemoglobin: 13.8 g/dL (ref 12.0–15.0)
MCH: 26.8 pg (ref 26.0–34.0)
MCHC: 33.8 g/dL (ref 30.0–36.0)
MCV: 79.4 fL — ABNORMAL LOW (ref 80.0–100.0)
Platelets: 301 10*3/uL (ref 150–400)
RBC: 5.14 MIL/uL — ABNORMAL HIGH (ref 3.87–5.11)
RDW: 13.9 % (ref 11.5–15.5)
WBC: 7.5 10*3/uL (ref 4.0–10.5)
nRBC: 0 % (ref 0.0–0.2)

## 2023-09-09 LAB — COMPREHENSIVE METABOLIC PANEL
ALT: 40 U/L (ref 0–44)
AST: 19 U/L (ref 15–41)
Albumin: 4.9 g/dL (ref 3.5–5.0)
Alkaline Phosphatase: 60 U/L (ref 38–126)
Anion gap: 9 (ref 5–15)
BUN: 7 mg/dL (ref 6–20)
CO2: 28 mmol/L (ref 22–32)
Calcium: 10.3 mg/dL (ref 8.9–10.3)
Chloride: 101 mmol/L (ref 98–111)
Creatinine, Ser: 0.81 mg/dL (ref 0.44–1.00)
GFR, Estimated: >60 mL/min (ref >60)
Glucose, Bld: 110 mg/dL — ABNORMAL HIGH (ref 70–99)
Potassium: 3.5 mmol/L (ref 3.5–5.1)
Sodium: 138 mmol/L (ref 135–145)
Total Bilirubin: 0.5 mg/dL (ref 0.0–1.2)
Total Protein: 8.5 g/dL — ABNORMAL HIGH (ref 6.5–8.1)

## 2023-09-09 LAB — LIPASE, BLOOD: Lipase: 74 U/L — ABNORMAL HIGH (ref 11–51)

## 2023-09-09 LAB — PREGNANCY, URINE: Preg Test, Ur: NEGATIVE

## 2023-09-09 MED ORDER — KETOROLAC TROMETHAMINE 30 MG/ML IJ SOLN
15.0000 mg | Freq: Once | INTRAMUSCULAR | Status: AC
  Administered 2023-09-09: 15 mg via INTRAVENOUS
  Filled 2023-09-09: qty 1

## 2023-09-09 MED ORDER — IOHEXOL 300 MG/ML  SOLN
100.0000 mL | Freq: Once | INTRAMUSCULAR | Status: AC | PRN
  Administered 2023-09-09: 100 mL via INTRAVENOUS

## 2023-09-09 MED ORDER — DICYCLOMINE HCL 10 MG PO CAPS
10.0000 mg | ORAL_CAPSULE | Freq: Once | ORAL | Status: AC
  Administered 2023-09-09: 10 mg via ORAL
  Filled 2023-09-09: qty 1

## 2023-09-09 MED ORDER — ONDANSETRON HCL 4 MG/2ML IJ SOLN
4.0000 mg | Freq: Once | INTRAMUSCULAR | Status: AC
  Administered 2023-09-09: 4 mg via INTRAVENOUS
  Filled 2023-09-09: qty 2

## 2023-09-09 MED ORDER — ALUM & MAG HYDROXIDE-SIMETH 200-200-20 MG/5ML PO SUSP
30.0000 mL | Freq: Once | ORAL | Status: AC
  Administered 2023-09-09: 30 mL via ORAL
  Filled 2023-09-09: qty 30

## 2023-09-09 NOTE — ED Provider Triage Note (Signed)
 Emergency Medicine Provider Triage Evaluation Note  Kellie Bautista , a 43 y.o. female recently admitted for enteritis was evaluated in triage.  Pt complains of worsening abdominal pain without bowel movements.  Review of Systems  Positive:  Negative:   Physical Exam  BP (!) 127/98 (BP Location: Right Arm)   Pulse 99   Temp 98.9 F (37.2 C) (Oral)   Resp 20   LMP 08/28/2023   SpO2 97%  Gen:   Awake, no distress   Resp:  Normal effort  MSK:   Moves extremities without difficulty  Other:  Abd: tender epigastric, no peritoneal signs, - CVAT  Medical Decision Making  Medically screening exam initiated at 3:45 PM.  Appropriate orders placed.  Kellie Bautista was informed that the remainder of the evaluation will be completed by another provider, this initial triage assessment does not replace that evaluation, and the importance of remaining in the ED until their evaluation is complete.     Kellie Lynwood DEL, PA-C 09/09/23 1553

## 2023-09-09 NOTE — ED Provider Notes (Signed)
 [place] EMERGENCY DEPARTMENT AT [facility] Provider Note   CSN: [id number] Arrival date & time: 09/09/23  1320     History  Chief Complaint  Patient presents with   Abdominal Pain    Kellie Bautista is a 43 y.o. female who presents with abdominal pain since Saturday.  Endorses nausea without vomiting or diarrhea.  Last bowel movement was on Friday.  Was recently admitted and discharged this past Friday for enteritis and AKI.    Abdominal Pain      Home Medications Prior to Admission medications   Medication Sig Start Date End Date Taking? Authorizing Provider  amLODipine  (NORVASC ) 10 MG tablet Take 1 tablet (10 mg total) by mouth daily. 07/16/23   Tanda Bleacher, MD  hydrochlorothiazide  (HYDRODIURIL ) 25 MG tablet Take 1 tablet (25 mg total) by mouth daily. 07/16/23   Tanda Bleacher, MD  ondansetron  (ZOFRAN ) 4 MG tablet Take 1 tablet (4 mg total) by mouth every 8 (eight) hours as needed for nausea or vomiting. 09/06/23   Bryn Bernardino NOVAK, MD  oxyCODONE  (OXY IR/ROXICODONE ) 5 MG immediate release tablet Take 1 tablet (5 mg total) by mouth every 6 (six) hours as needed for moderate pain (pain score 4-6) or severe pain (pain score 7-10). 09/06/23   Bryn Bernardino NOVAK, MD  pantoprazole  (PROTONIX ) 20 MG tablet Take 1 tablet (20 mg total) by mouth daily. 07/16/23   Tanda Bleacher, MD      Allergies    Shrimp (diagnostic)    Review of Systems   Review of Systems  Gastrointestinal:  Positive for abdominal pain.    Physical Exam Updated Vital Signs BP (!) 138/93 (BP Location: Right Arm)   Pulse 89   Temp 98.5 F (36.9 C) (Oral)   Resp 18   LMP 08/28/2023   SpO2 99%  Physical Exam Vitals and nursing note reviewed.  Constitutional:      General: She is not in acute distress.    Appearance: She is well-developed.  HENT:     Head: Normocephalic and atraumatic.  Eyes:     Conjunctiva/sclera: Conjunctivae normal.  Cardiovascular:     Rate and Rhythm: Normal rate and regular  rhythm.     Heart sounds: No murmur heard. Pulmonary:     Effort: Pulmonary effort is normal. No respiratory distress.     Breath sounds: Normal breath sounds.  Abdominal:     General: Bowel sounds are normal.     Palpations: Abdomen is soft.     Tenderness: There is abdominal tenderness in the epigastric area. Negative signs include Murphy's sign, Rovsing's sign and McBurney's sign.  Musculoskeletal:        General: No swelling.     Cervical back: Neck supple.  Skin:    General: Skin is warm and dry.     Capillary Refill: Capillary refill takes less than 2 seconds.  Neurological:     Mental Status: She is alert.  Psychiatric:        Mood and Affect: Mood normal.     ED Results / Procedures / Treatments   Labs (all labs ordered are listed, but only abnormal results are displayed) Labs Reviewed  LIPASE, BLOOD - Abnormal; Notable for the following components:      Result Value   Lipase 74 (*)    All other components within normal limits  COMPREHENSIVE METABOLIC PANEL - Abnormal; Notable for the following components:   Glucose, Bld 110 (*)    Total Protein 8.5 (*)  All other components within normal limits  CBC - Abnormal; Notable for the following components:   RBC 5.14 (*)    MCV 79.4 (*)    All other components within normal limits  URINALYSIS, ROUTINE W REFLEX MICROSCOPIC  PREGNANCY, URINE    EKG None  Radiology CT ABDOMEN PELVIS W CONTRAST Result Date: 09/09/2023 CLINICAL DATA:  Abdominal pain, acute, nonlocalized EXAM: CT ABDOMEN AND PELVIS WITH CONTRAST TECHNIQUE: Multidetector CT imaging of the abdomen and pelvis was performed using the standard protocol following bolus administration of intravenous contrast. RADIATION DOSE REDUCTION: This exam was performed according to the departmental dose-optimization program which includes automated exposure control, adjustment of the mA and/or kV according to patient size and/or use of iterative reconstruction technique.  CONTRAST:  OMNIPAQUE  IOHEXOL  300 MG/ML  SOLN COMPARISON:  09/04/2023 FINDINGS: Lower chest: No acute abnormality Hepatobiliary: No focal hepatic abnormality. Gallbladder unremarkable. Pancreas: No focal abnormality or ductal dilatation. Spleen: No focal abnormality.  Normal size. Adrenals/Urinary Tract: No adrenal abnormality. No focal renal abnormality. No stones or hydronephrosis. Urinary bladder is unremarkable. Stomach/Bowel: Moderate stool burden throughout the colon. Normal appendix. Stomach and small bowel decompressed, unremarkable. Vascular/Lymphatic: No evidence of aneurysm or adenopathy. Reproductive: Uterus and adnexa unremarkable.  No mass. Other: No free fluid or free air. Musculoskeletal: No acute bony abnormality. IMPRESSION: No acute findings in the abdomen or pelvis. Moderate stool burden throughout the colon. Electronically Signed   By: Franky Crease M.D.   On: 09/09/2023 19:03    Procedures Procedures    Medications Ordered in ED Medications  alum & mag hydroxide-simeth (MAALOX/MYLANTA) 200-200-20 MG/5ML suspension 30 mL (30 mLs Oral Given 09/09/23 1823)  dicyclomine  (BENTYL ) capsule 10 mg (10 mg Oral Given 09/09/23 1822)  ketorolac  (TORADOL ) 30 MG/ML injection 15 mg (15 mg Intravenous Given 09/09/23 1836)  ondansetron  (ZOFRAN ) injection 4 mg (4 mg Intravenous Given 09/09/23 1836)  iohexol  (OMNIPAQUE ) 300 MG/ML solution 100 mL (100 mLs Intravenous Contrast Given 09/09/23 1838)    ED Course/ Medical Decision Making/ A&P                                 Medical Decision Making Amount and/or Complexity of Data Reviewed Labs: ordered. Radiology: ordered.  Risk OTC drugs. Prescription drug management.   This patient presents to the ED with chief complaint(s) of abdominal pain.  The complaint involves an extensive differential diagnosis and also carries with it a high risk of complications and morbidity.   pertinent past medical history as listed in HPI  The differential  diagnosis includes  Cholecystitis, pancreatitis, appendicitis, gastroenteritis, constipation The initial plan is to  Obtain basic labs Additional history obtained: Additional history obtained from significant other Records reviewed previous admission documents  Initial Assessment:   Patient is hemodynamically stable, nontoxic-appearing presenting with abdominal pain x 3 days after being discharged for enteritis and AKI.  Creatinine is back at baseline today.  She demonstrates epigastric tenderness today.  No bowel movement since Friday, she is passing flatus, bowel sounds normal on exam.  No peritoneal signs.   Independent ECG interpretation:  none  Independent labs interpretation:  The following labs were independently interpreted:  CBC without leukocytosis, CMP without significant abnormality, UA within normal limits, lipase mildly elevated at 74, hCG negative  Independent visualization and interpretation of imaging: I independently visualized the following imaging with scope of interpretation limited to determining acute life threatening conditions related to emergency care:  CT abdomen pelvis, which revealed moderate stool burden without acute abnormality  Treatment and Reassessment: Patient given GI cocktail, Zofran  and Toradol  following first assessment  8:20 PM patient reports marked improvement of symptoms.  Discussed CT scan findings with her, educated on therapeutics for constipation.  Discussed discharge plan.  She is agreeable.  Consultations obtained:   none  Disposition:   Patient discharged home.  Encouraged to use MiraLAX and senna daily to follow-up with primary care provider within the next 3 to 5 days. The patient has been appropriately medically screened and/or stabilized in the ED. I have low suspicion for any other emergent medical condition which would require further screening, evaluation or treatment in the ED or require inpatient management. At time of discharge  the patient is hemodynamically stable and in no acute distress. I have discussed work-up results and diagnosis with patient and answered all questions. Patient is agreeable with discharge plan. We discussed strict return precautions for returning to the emergency department and they verbalized understanding.     Social Determinants of Health:   none  This note was dictated with voice recognition software.  Despite best efforts at proofreading, errors may have occurred which can change the documentation meaning.          Final Clinical Impression(s) / ED Diagnoses Final diagnoses:  Epigastric pain  Constipation, unspecified constipation type    Rx / DC Orders ED Discharge Orders     None         [name] VEAR DEVONNA 09/09/23 2026    Jerrol Lynwood, MD 09/09/23 2126

## 2023-09-09 NOTE — Discharge Instructions (Addendum)
 It was a pleasure taking care of you today you were evaluated in the emergency room for abdominal pain.  You were given medication for pain and nausea.  Your CT scan did not show any acute abnormality, however as discussed it did show that you are moderately constipated.  I would recommend you take MiraLAX, senna and Metamucil daily until you have a bowel movement.  Recommend you follow-up with your primary care provider within the next 3 to 5 days to ensure your symptoms are resolving.  If you experience any new or worsening symptoms including severe abdominal pain, vomiting, fevers please return to the emergency room.

## 2023-09-09 NOTE — Transitions of Care (Post Inpatient/ED Visit) (Signed)
 09/09/2023  Name: Kellie Bautista MRN: [id number] DOB: 02/04/1981  Today's TOC FU Call Status: Today's TOC FU Call Status:: Successful TOC FU Call Completed TOC FU Call Complete Date: 09/09/23 Patient's Name and Date of Birth confirmed.  Transition Care Management Follow-up Telephone Call Date of Discharge: 09/06/23 Discharge Facility: [facility]) Type of Discharge: Inpatient Admission Primary Inpatient Discharge Diagnosis:: AKI How have you been since you were released from the hospital?: Worse (She said she is on her way back to the hospital.She said she hasn't been able to eat since she has been home from the hospital and has not moved her bowels.She reports nausea, no vomiting or diarrhea.She has been taking zofran  but no relief of nausea.) Any questions or concerns?: Yes Patient Questions/Concerns:: noted above. She explained how she has been feeling worse since discharge.  She stated they told her she had a  stomach bug but she does not feel her symptoms are a stomach bug because she is getting worse. She said her husband can take her to the ED Patient Questions/Concerns Addressed: Notified Provider of Patient Questions/Concerns  Items Reviewed: Did you receive and understand the discharge instructions provided?: Yes Medications obtained,verified, and reconciled?: Yes (Medications Reviewed) (She has all of her medications and did not have any questions about the med regime.) Any new allergies since your discharge?: No Dietary orders reviewed?: No Do you have support at home?: Yes People in Home: spouse Name of Support/Comfort Primary Source: husband  Medications Reviewed Today: Medications Reviewed Today     Reviewed by Marvis Bradley, RN (Case Manager) on 09/09/23 at 1237  Med List Status: <None>   Medication Order Taking? Sig Documenting Provider Last Dose Status Informant  amLODipine  (NORVASC ) 10 MG tablet [id number] No Take 1 tablet (10 mg total) by mouth daily. Tanda Bleacher, MD Past Week Active Self, Pharmacy Records  hydrochlorothiazide  (HYDRODIURIL ) 25 MG tablet [id number] No Take 1 tablet (25 mg total) by mouth daily. Tanda Bleacher, MD Past Week Active Self, Pharmacy Records  ondansetron  (ZOFRAN ) 4 MG tablet [id number]  Take 1 tablet (4 mg total) by mouth every 8 (eight) hours as needed for nausea or vomiting. Bryn Bernardino NOVAK, MD  Active   oxyCODONE  (OXY IR/ROXICODONE ) 5 MG immediate release tablet [id number]  Take 1 tablet (5 mg total) by mouth every 6 (six) hours as needed for moderate pain (pain score 4-6) or severe pain (pain score 7-10). Bryn Bernardino NOVAK, MD  Active   pantoprazole  (PROTONIX ) 20 MG tablet [id number] No Take 1 tablet (20 mg total) by mouth daily. Tanda Bleacher, MD More than a month Active Self, Pharmacy Records            Home Care and Equipment/Supplies: Were Home Health Services Ordered?: No Any new equipment or medical supplies ordered?: No  Functional Questionnaire: Do you need assistance with bathing/showering or dressing?: No Do you need assistance with meal preparation?: No Do you need assistance with eating?: No Do you have difficulty maintaining continence: No Do you need assistance with getting out of bed/getting out of a chair/moving?: No Do you have difficulty managing or taking your medications?: No  Follow up appointments reviewed: PCP Follow-up appointment confirmed?: Yes Date of PCP follow-up appointment?: 10/11/23 Follow-up Provider: Dr Tanda- the patient was on her way to the ED. I explained to her that she can call [facility] to request a sooner appointment after she addresses her current concerns in ED Specialist Hospital Follow-up appointment confirmed?: NA Do you need transportation to your  follow-up appointment?: No Do you understand care options if your condition(s) worsen?: Yes-patient verbalized understanding    SIGNATURE. Slater Diesel, RN

## 2023-09-09 NOTE — ED Triage Notes (Signed)
 Recent hospitalization d/c Friday  Abdo pain since Saturday with some nausea. Last BM Friday, reports not eating   "My stomach is swollen at the top"

## 2023-09-09 NOTE — Telephone Encounter (Addendum)
 Dr TandaGLENWOOD LIPS from [facility] call:  When asked how she is feeling, she said she is feeling worse and on her way back to the hospital.She said she hasn't been able to eat since she has been home from the hospital and has not moved her bowels.She reports nausea, no vomiting or diarrhea.She has been taking zofran  but no relief of nausea.  She stated they told her she had a  stomach bug but she does not feel her symptoms are a stomach bug because she is getting worse. She said her husband can take her to the ED.  She is also requesting a referral to GI

## 2023-09-11 NOTE — Telephone Encounter (Signed)
 Call placed to patient to schedule an appointment with Dr Andrey Campanile to discuss option of GI referral.  Message left with call back requested.

## 2023-09-19 NOTE — Telephone Encounter (Signed)
I called the patient again to inform her that Dr Andrey Campanile will discuss the option of a GI referral at her upcoming appointment on 10/11/2023.  We can look for an earlier appointment if she would like.  Message left with call back requested.

## 2023-09-30 ENCOUNTER — Ambulatory Visit: Payer: Self-pay | Admitting: Neurology

## 2023-10-11 ENCOUNTER — Ambulatory Visit: Payer: Medicaid Other | Admitting: Family Medicine

## 2023-10-11 ENCOUNTER — Telehealth: Payer: Self-pay | Admitting: Family Medicine

## 2023-10-11 NOTE — Telephone Encounter (Signed)
 Called patient to reschedule missed appointment no answer left voicemail

## 2023-10-20 IMAGING — CT CT HEAD W/O CM
4 series · 17 of 47 positions shown, 19 images · non-contrast
Comparison: 04/05/2021

CLINICAL DATA: Headaches with increasing frequency and new
features.



[Series 3: head wo · axial · 0.47mm/px · z∈[-70,+50]mm · 7 of 33 slices shown, 9 images]
[im 5/33  brain]
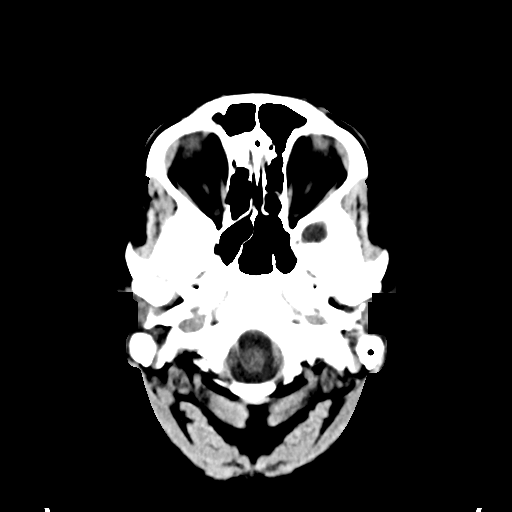
[im 5/33  bone]
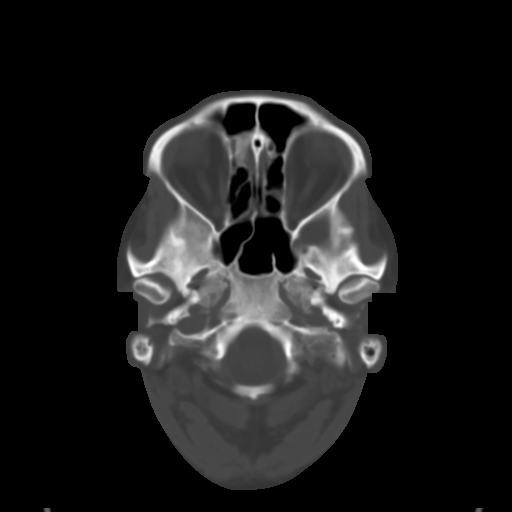
[im 9/33  brain]
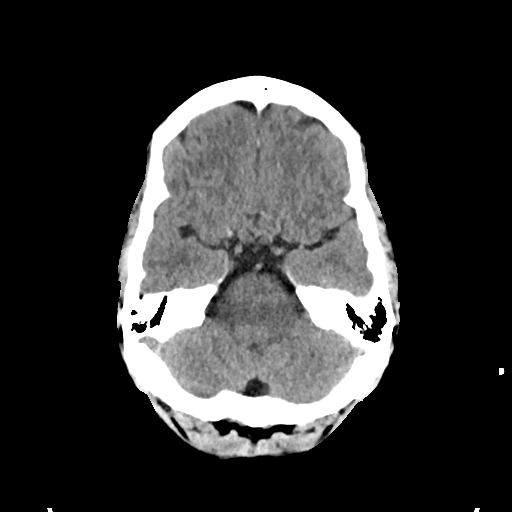
[im 13/33  brain]
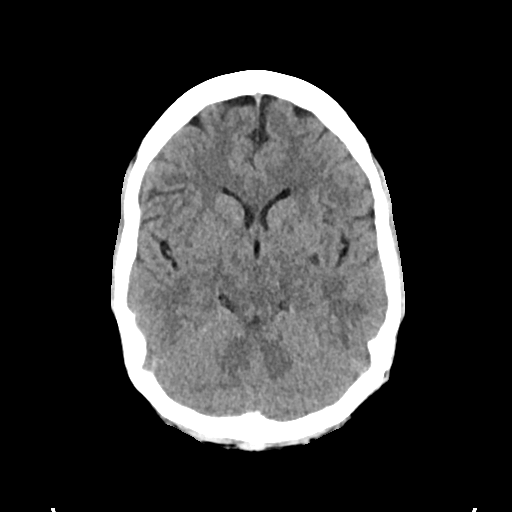
[im 17/33  brain]
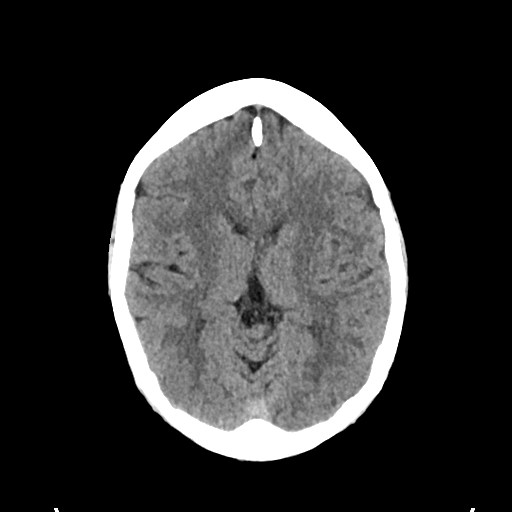
[im 21/33  brain]
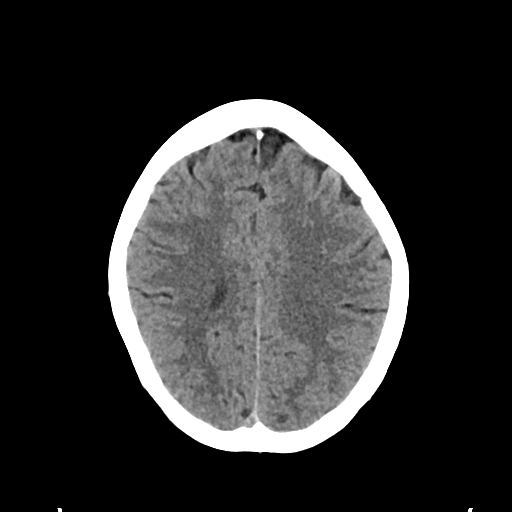
[im 21/33  bone]
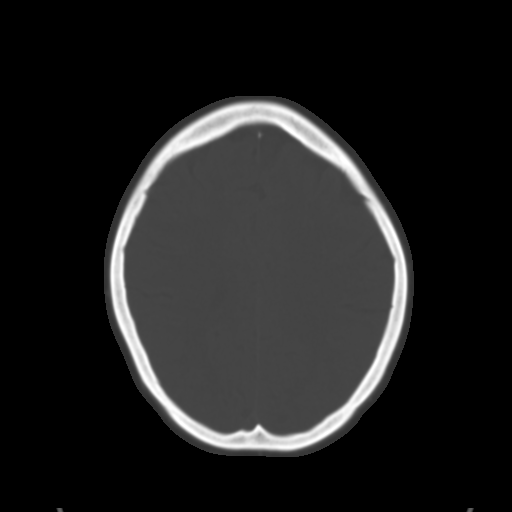
[im 25/33  brain]
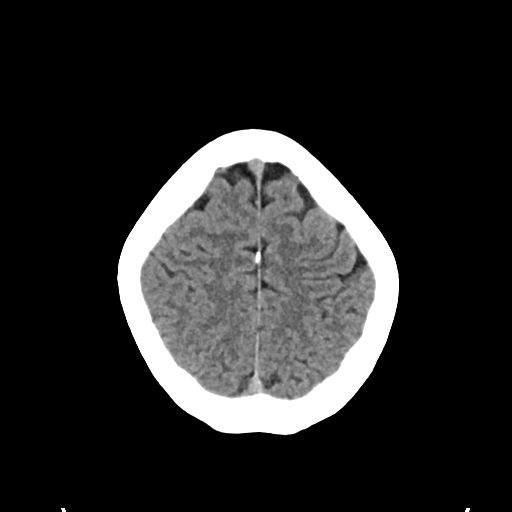
[im 29/33  brain]
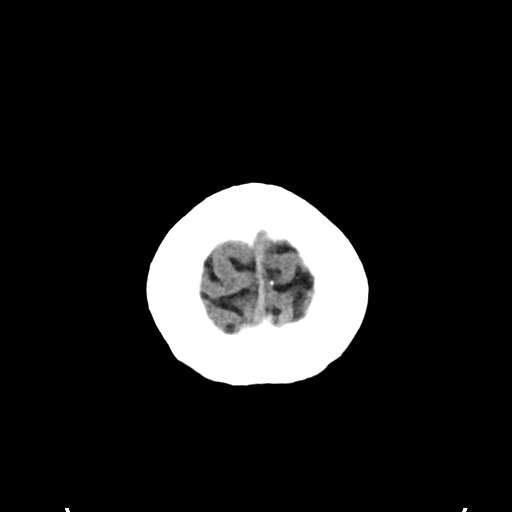

[Series 4: head bone · axial · 0.47mm/px · z∈[-74,-18]mm · 4 of 81 slices shown]
[im 9/81  bone]
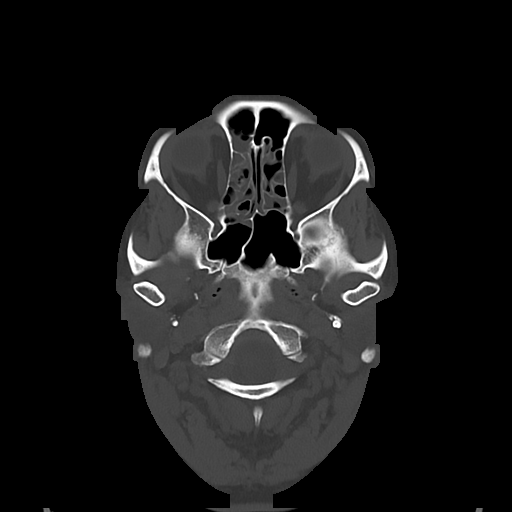
[im 17/81  bone]
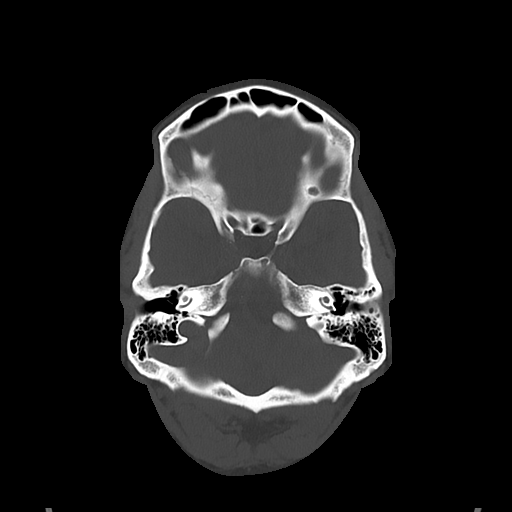
[im 25/81  bone]
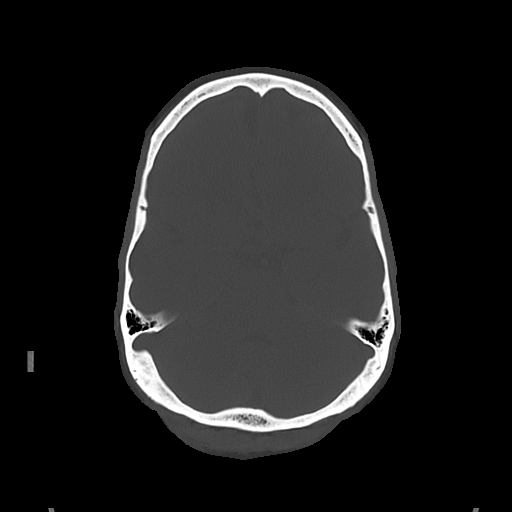
[im 37/81  bone]
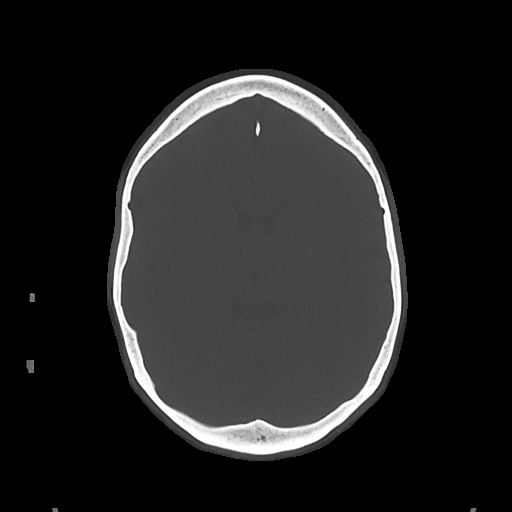

[Series 5: cor soft · coronal · 0.36mm/px · 3 of 67 slices shown]
[im 23/67  brain]
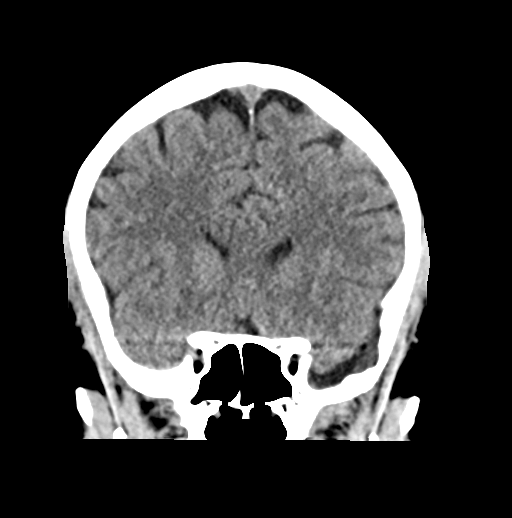
[im 30/67  brain]
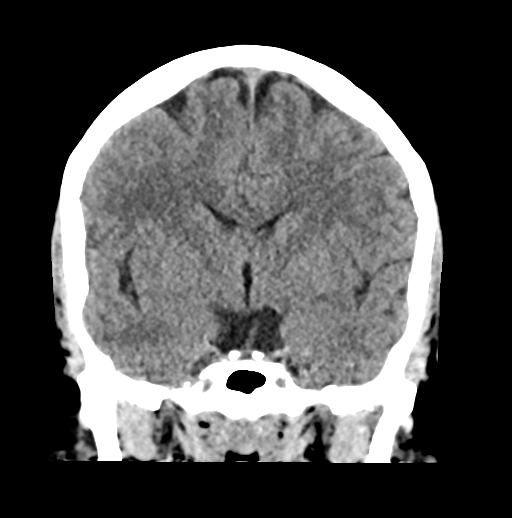
[im 37/67  brain]
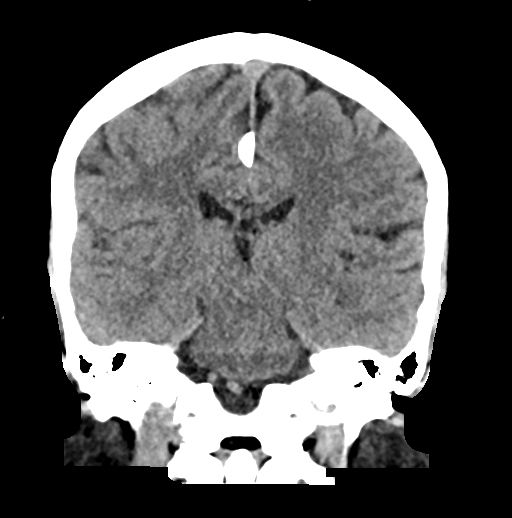

[Series 6: sag soft · sagittal · 0.36mm/px · 3 of 62 slices shown]
[im 21/62  brain]
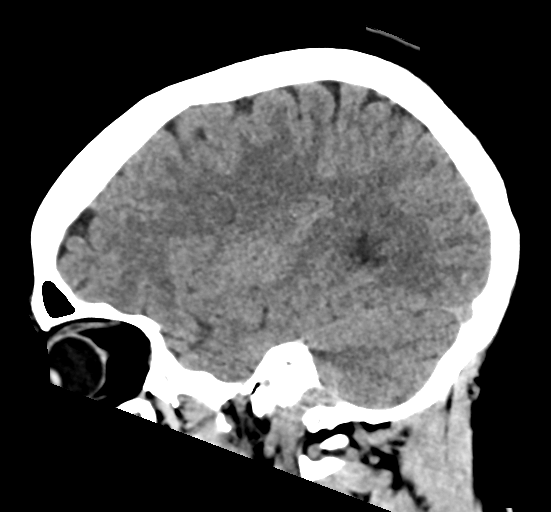
[im 31/62  brain]
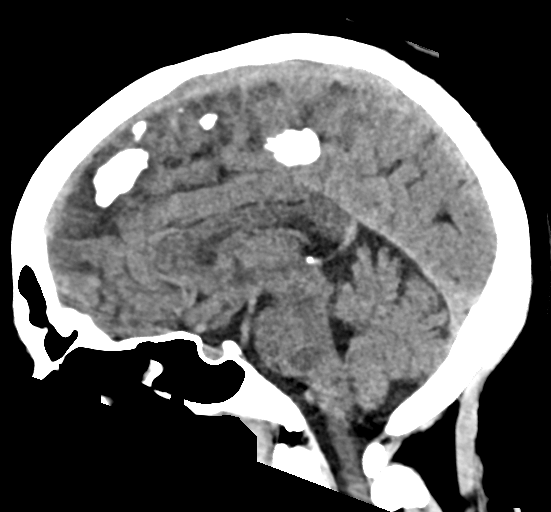
[im 41/62  brain]
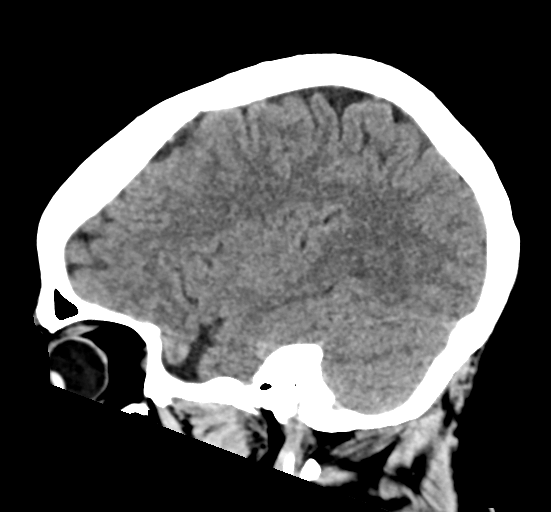

[17 of 47 positions shown; findings below may reference images not displayed]

FINDINGS: Brain: No evidence of intracranial hemorrhage, acute infarction,
hydrocephalus, extra-axial collection, or mass lesion/mass effect.

Vascular:  No hyperdense vessel or other acute findings.

Skull: No evidence of fracture or other significant bone
abnormality.

Sinuses/Orbits: No acute findings. Mucosal thickening is again seen
involving the bilateral ethmoid and right maxillary sinuses.

Other: None.
IMPRESSION: No intracranial abnormality.

Chronic sinusitis.

## 2023-10-31 ENCOUNTER — Ambulatory Visit: Payer: Medicaid Other | Admitting: Family Medicine

## 2023-10-31 ENCOUNTER — Encounter: Payer: Self-pay | Admitting: Family Medicine

## 2023-10-31 VITALS — BP 120/86 | HR 96 | Temp 98.2°F | Resp 16 | Ht 64.0 in | Wt 191.8 lb

## 2023-10-31 DIAGNOSIS — I10 Essential (primary) hypertension: Secondary | ICD-10-CM | POA: Diagnosis not present

## 2023-10-31 DIAGNOSIS — M2550 Pain in unspecified joint: Secondary | ICD-10-CM

## 2023-10-31 DIAGNOSIS — R41840 Attention and concentration deficit: Secondary | ICD-10-CM

## 2023-10-31 DIAGNOSIS — R5383 Other fatigue: Secondary | ICD-10-CM

## 2023-11-01 ENCOUNTER — Telehealth: Payer: Self-pay | Admitting: Emergency Medicine

## 2023-11-01 ENCOUNTER — Ambulatory Visit: Payer: Self-pay | Admitting: Family Medicine

## 2023-11-01 LAB — CBC WITH DIFFERENTIAL/PLATELET
Basophils Absolute: 0.1 10*3/uL (ref 0.0–0.2)
Basos: 1 %
EOS (ABSOLUTE): 0.3 10*3/uL (ref 0.0–0.4)
Eos: 4 %
Hematocrit: 42.7 % (ref 34.0–46.6)
Hemoglobin: 13.8 g/dL (ref 11.1–15.9)
Immature Grans (Abs): 0 10*3/uL (ref 0.0–0.1)
Immature Granulocytes: 0 %
Lymphocytes Absolute: 2.8 10*3/uL (ref 0.7–3.1)
Lymphs: 36 %
MCH: 26.5 pg — ABNORMAL LOW (ref 26.6–33.0)
MCHC: 32.3 g/dL (ref 31.5–35.7)
MCV: 82 fL (ref 79–97)
Monocytes Absolute: 0.7 10*3/uL (ref 0.1–0.9)
Monocytes: 9 %
Neutrophils Absolute: 3.9 10*3/uL (ref 1.4–7.0)
Neutrophils: 50 %
Platelets: 307 10*3/uL (ref 150–450)
RBC: 5.2 x10E6/uL (ref 3.77–5.28)
RDW: 13.2 % (ref 11.7–15.4)
WBC: 7.8 10*3/uL (ref 3.4–10.8)

## 2023-11-01 LAB — BASIC METABOLIC PANEL
BUN/Creatinine Ratio: 6 — ABNORMAL LOW (ref 9–23)
BUN: 5 mg/dL — ABNORMAL LOW (ref 6–24)
CO2: 24 mmol/L (ref 20–29)
Calcium: 10.3 mg/dL — ABNORMAL HIGH (ref 8.7–10.2)
Chloride: 102 mmol/L (ref 96–106)
Creatinine, Ser: 0.86 mg/dL (ref 0.57–1.00)
Glucose: 93 mg/dL (ref 70–99)
Potassium: 3.2 mmol/L — ABNORMAL LOW (ref 3.5–5.2)
Sodium: 142 mmol/L (ref 134–144)
eGFR: 85 mL/min/{1.73_m2} (ref >59)

## 2023-11-01 LAB — RHEUMATOID FACTOR: Rheumatoid fact SerPl-aCnc: <10 [IU]/mL (ref <14.0)

## 2023-11-01 LAB — ANA: Anti Nuclear Antibody (ANA): NEGATIVE

## 2023-11-01 LAB — SEDIMENTATION RATE: Sed Rate: 65 mm/h — ABNORMAL HIGH (ref 0–32)

## 2023-11-01 NOTE — Telephone Encounter (Signed)
-   Kidney function normal. - No anemia. - ANA negative. - Sedimentation rate above goal. - Rheumatoid factor normal. - Office visit note has not been signed as of present. Unsure if Georganna Skeans, MD has additional orders she was planning to enter. During the interim recommendation to report to Emergency Department/Urgent Care/call 911 for immediate medical evaluation. Follow-up with Georganna Skeans, MD on 11/04/2023 .

## 2023-11-01 NOTE — Telephone Encounter (Signed)
 Pt seen yesterday in the office with Dr. Andrey Campanile. Pt reports 8/10 arthritic pain in her shoulder, back, hands, and legs. Pain is not new or changed from yesterday. Pt has no new symptoms from yesterday. Pt taking Tylenol with no relief. Pt is requesting that Dr. Andrey Campanile prescribe her some type of pain medication since Tylenol is not working. No numbness, weakness, tingling. RN advised pt RN would relay the concern to the office for follow-up.  Copied from CRM [id number]. Topic: Clinical - Red Word Triage >> Nov 01, 2023  1:38 PM Dennison Nancy wrote: Red Word that prompted transfer to Nurse Triage: Patient is having a lot of joint pain different parts of her body and fatigue and achy. rate pain 8  requesting if can get some pain medication Reason for Disposition  Back pain is a chronic symptom (recurrent or ongoing AND present > 4 weeks)  Answer Assessment - Initial Assessment Questions 1. ONSET: "When did the pain begin?"      "Always dealing with it, getting worse and worse" 2. LOCATION: "Where does it hurt?" (upper, mid or lower back)     Shoulder, back, hands, legs 3. SEVERITY: "How bad is the pain?"  (e.g., Scale 1-10; mild, moderate, or severe)   - MILD (1-3): Doesn't interfere with normal activities.    - MODERATE (4-7): Interferes with normal activities or awakens from sleep.    - SEVERE (8-10): Excruciating pain, unable to do any normal activities.      8/10 4. PATTERN: "Is the pain constant?" (e.g., yes, no; constant, intermittent)      Constant 5. RADIATION: "Does the pain shoot into your legs or somewhere else?"     Arthritis, pain everywhere  6. CAUSE:  "What do you think is causing the back pain?"      Arthritis 7. BACK OVERUSE:  "Any recent lifting of heavy objects, strenuous work or exercise?"     No 8. MEDICINES: "What have you taken so far for the pain?" (e.g., nothing, acetaminophen, NSAIDS)     Tylenol - not effective 9. NEUROLOGIC SYMPTOMS: "Do you have any weakness, numbness,  or problems with bowel/bladder control?"     No 10. OTHER SYMPTOMS: "Do you have any other symptoms?" (e.g., fever, abdomen pain, burning with urination, blood in urine)       No  Protocols used: Back Pain-A-AH

## 2023-11-01 NOTE — Telephone Encounter (Addendum)
 Copied from CRM [id number]. Topic: Clinical - Red Word Triage >> Nov 01, 2023  1:38 PM Dennison Nancy wrote: Red Word that prompted transfer to Nurse Triage: Patient is having a lot of joint pain different parts of her body and fatigue and achy. rate pain 8  requesting if can get some pain medication   Patient stated that she was seen in the office yesterday due to hand swelling, pain in knees, shoulders, ankles. She suspects that she has arthritis. She stated the pain is bothering her. Tylenol is not helping. She denies any change in symptoms. She wants to know if the provider can prescribe something to help with her symptoms. She stated she had labs done and she is waiting on results. It appears that labs have resulted today but have not been reviewed by the provider yet.

## 2023-11-01 NOTE — Telephone Encounter (Signed)
 Patient want to know if she can get some pain medication for swelling in her hands.  She was here yesterday to see MD Andrey Campanile however her labs have not been resulted.. Patient stated "she miss work today due to pain."  Please advise

## 2023-11-01 NOTE — Telephone Encounter (Signed)
 I called patient and made her aware of  Kidney function normal. - No anemia. - ANA negative. - Sedimentation rate above goal. - Rheumatoid factor normal. - Office visit note has not been signed as of present. Unsure if Georganna Skeans, MD has additional orders she was planning to enter. During the interim recommendation to report to Emergency Department/Urgent Care/call 911 for immediate medical evaluation. Follow-up with Georganna Skeans, MD on 11/04/2023 .

## 2023-11-04 ENCOUNTER — Encounter: Payer: Self-pay | Admitting: Family Medicine

## 2023-11-04 NOTE — Progress Notes (Signed)
 Established Patient Office Visit  Subjective    Patient ID: Kellie Bautista, female    DOB: 1981/07/24  Age: 43 y.o. MRN: [id number]  CC:  Chief Complaint  Patient presents with   Follow-up    Fatigue, body aches    HPI Kellie Bautista presents for routine follow up of hypertension. Patient also complains of fatigue with joint pains and difficulty concentrating.   Outpatient Encounter Medications as of 10/31/2023  Medication Sig   amLODipine (NORVASC) 10 MG tablet Take 1 tablet (10 mg total) by mouth daily.   hydrochlorothiazide (HYDRODIURIL) 25 MG tablet Take 1 tablet (25 mg total) by mouth daily.   ondansetron (ZOFRAN) 4 MG tablet Take 1 tablet (4 mg total) by mouth every 8 (eight) hours as needed for nausea or vomiting.   oxyCODONE (OXY IR/ROXICODONE) 5 MG immediate release tablet Take 1 tablet (5 mg total) by mouth every 6 (six) hours as needed for moderate pain (pain score 4-6) or severe pain (pain score 7-10).   pantoprazole (PROTONIX) 20 MG tablet Take 1 tablet (20 mg total) by mouth daily.   No facility-administered encounter medications on file as of 10/31/2023.    Past Medical History:  Diagnosis Date   Graves disease    Hypertension     Past Surgical History:  Procedure Laterality Date   CESAREAN SECTION     IR 3D INDEPENDENT WKST  08/23/2023   IR ANGIO INTRA EXTRACRAN SEL INTERNAL CAROTID BILAT MOD SED  08/23/2023   IR ANGIO VERTEBRAL SEL VERTEBRAL BILAT MOD SED  08/23/2023   TUBAL LIGATION     2008    Family History  Problem Relation Age of Onset   Hypertension Mother    Hypertension Brother    Graves' disease Maternal Grandmother    Diabetes Maternal Grandmother     Social History   Socioeconomic History   Marital status: Married    Spouse name: Not on file   Number of children: Not on file   Years of education: Not on file   Highest education level: Associate degree: academic program  Occupational History   Not on file  Tobacco Use   Smoking  status: Every Day    Current packs/day: 0.25    Types: Cigarettes   Smokeless tobacco: Never  Vaping Use   Vaping status: Never Used  Substance and Sexual Activity   Alcohol use: Yes    Comment: rare   Drug use: Never   Sexual activity: Never  Other Topics Concern   Not on file  Social History Narrative   Caffiene 1 cip coffee dailly   Work: [employer], Office manager. Works days.   Social Drivers of Corporate investment banker Strain: Low Risk  (10/30/2023)   Overall Financial Resource Strain (CARDIA)    Difficulty of Paying Living Expenses: Not hard at all  Food Insecurity: No Food Insecurity (10/30/2023)   Hunger Vital Sign    Worried About Running Out of Food in the Last Year: Never true    Ran Out of Food in the Last Year: Never true  Transportation Needs: No Transportation Needs (10/30/2023)   PRAPARE - Administrator, Civil Service (Medical): No    Lack of Transportation (Non-Medical): No  Physical Activity: Sufficiently Active (06/17/2023)   Exercise Vital Sign    Days of Exercise per Week: 5 days    Minutes of Exercise per Session: 30 min  Stress: No Stress Concern Present (06/17/2023)   [employer] of Occupational  Health - Occupational Stress Questionnaire    Feeling of Stress : Only a little  Social Connections: Moderately Isolated (10/30/2023)   Social Connection and Isolation Panel [NHANES]    Frequency of Communication with Friends and Family: Once a week    Frequency of Social Gatherings with Friends and Family: Once a week    Attends Religious Services: More than 4 times per year    Active Member of [employer] or Organizations: No    Attends Banker Meetings: Never    Marital Status: Married  Catering manager Violence: Not At Risk (09/04/2023)   Humiliation, Afraid, Rape, and Kick questionnaire    Fear of Current or Ex-Partner: No    Emotionally Abused: No    Physically Abused: No    Sexually Abused: No    Review of Systems   Constitutional:  Positive for malaise/fatigue.  Musculoskeletal:  Positive for joint pain.  All other systems reviewed and are negative.       Objective    BP 120/86   Pulse 96   Temp 98.2 F (36.8 C) (Oral)   Resp 16   Ht 5\' 4"  (1.626 m)   Wt 191 lb 12.8 oz (87 kg)   SpO2 97%   BMI 32.92 kg/m   Physical Exam Vitals and nursing note reviewed.  Constitutional:      General: She is not in acute distress. Cardiovascular:     Rate and Rhythm: Normal rate and regular rhythm.  Pulmonary:     Effort: Pulmonary effort is normal.     Breath sounds: Normal breath sounds.  Abdominal:     Palpations: Abdomen is soft.     Tenderness: There is no abdominal tenderness.  Musculoskeletal:        General: Tenderness (multiple joints) present.  Neurological:     General: No focal deficit present.     Mental Status: She is alert and oriented to person, place, and time.  Psychiatric:        Mood and Affect: Mood normal.        Behavior: Behavior normal.         Assessment & Plan:   1. Essential hypertension (Primary) Appears stable. Continue   2. Other fatigue Labs ordered - CBC with Differential - Basic Metabolic Panel  3. Arthralgia, unspecified joint Labs ordered - Sedimentation Rate - ANA - Rheumatoid factor  4. Attention and concentration deficit Referral to [facility] for further eval/mgt - Ambulatory referral to Psychology  Return in about 3 months (around 01/28/2024) for follow up.   Tommie Raymond, MD

## 2023-11-28 DIAGNOSIS — Z1159 Encounter for screening for other viral diseases: Secondary | ICD-10-CM | POA: Diagnosis not present

## 2023-11-28 DIAGNOSIS — G8929 Other chronic pain: Secondary | ICD-10-CM | POA: Diagnosis not present

## 2023-11-28 DIAGNOSIS — M25531 Pain in right wrist: Secondary | ICD-10-CM | POA: Diagnosis not present

## 2023-11-28 DIAGNOSIS — E559 Vitamin D deficiency, unspecified: Secondary | ICD-10-CM | POA: Diagnosis not present

## 2023-11-28 DIAGNOSIS — M79641 Pain in right hand: Secondary | ICD-10-CM | POA: Diagnosis not present

## 2023-11-28 DIAGNOSIS — M79642 Pain in left hand: Secondary | ICD-10-CM | POA: Diagnosis not present

## 2023-11-28 DIAGNOSIS — M25532 Pain in left wrist: Secondary | ICD-10-CM | POA: Diagnosis not present

## 2023-11-28 DIAGNOSIS — M129 Arthropathy, unspecified: Secondary | ICD-10-CM | POA: Diagnosis not present

## 2023-11-28 DIAGNOSIS — M25511 Pain in right shoulder: Secondary | ICD-10-CM | POA: Diagnosis not present

## 2023-11-28 DIAGNOSIS — Z131 Encounter for screening for diabetes mellitus: Secondary | ICD-10-CM | POA: Diagnosis not present

## 2023-11-28 DIAGNOSIS — N914 Secondary oligomenorrhea: Secondary | ICD-10-CM | POA: Diagnosis not present

## 2023-11-28 DIAGNOSIS — M25512 Pain in left shoulder: Secondary | ICD-10-CM | POA: Diagnosis not present

## 2023-11-28 DIAGNOSIS — E669 Obesity, unspecified: Secondary | ICD-10-CM | POA: Diagnosis not present

## 2023-11-28 DIAGNOSIS — Z79899 Other long term (current) drug therapy: Secondary | ICD-10-CM | POA: Diagnosis not present

## 2023-12-02 DIAGNOSIS — Z79899 Other long term (current) drug therapy: Secondary | ICD-10-CM | POA: Diagnosis not present

## 2023-12-03 DIAGNOSIS — M79642 Pain in left hand: Secondary | ICD-10-CM | POA: Diagnosis not present

## 2023-12-03 DIAGNOSIS — N914 Secondary oligomenorrhea: Secondary | ICD-10-CM | POA: Diagnosis not present

## 2023-12-03 DIAGNOSIS — M25532 Pain in left wrist: Secondary | ICD-10-CM | POA: Diagnosis not present

## 2023-12-03 DIAGNOSIS — M25531 Pain in right wrist: Secondary | ICD-10-CM | POA: Diagnosis not present

## 2023-12-03 DIAGNOSIS — Z79899 Other long term (current) drug therapy: Secondary | ICD-10-CM | POA: Diagnosis not present

## 2023-12-03 DIAGNOSIS — M1990 Unspecified osteoarthritis, unspecified site: Secondary | ICD-10-CM | POA: Diagnosis not present

## 2023-12-03 DIAGNOSIS — E669 Obesity, unspecified: Secondary | ICD-10-CM | POA: Diagnosis not present

## 2023-12-03 DIAGNOSIS — M79641 Pain in right hand: Secondary | ICD-10-CM | POA: Diagnosis not present

## 2023-12-03 DIAGNOSIS — F17210 Nicotine dependence, cigarettes, uncomplicated: Secondary | ICD-10-CM | POA: Diagnosis not present

## 2023-12-05 DIAGNOSIS — Z79899 Other long term (current) drug therapy: Secondary | ICD-10-CM | POA: Diagnosis not present

## 2024-01-01 DIAGNOSIS — M79641 Pain in right hand: Secondary | ICD-10-CM | POA: Diagnosis not present

## 2024-01-01 DIAGNOSIS — M25532 Pain in left wrist: Secondary | ICD-10-CM | POA: Diagnosis not present

## 2024-01-01 DIAGNOSIS — E669 Obesity, unspecified: Secondary | ICD-10-CM | POA: Diagnosis not present

## 2024-01-01 DIAGNOSIS — M1990 Unspecified osteoarthritis, unspecified site: Secondary | ICD-10-CM | POA: Diagnosis not present

## 2024-01-01 DIAGNOSIS — N914 Secondary oligomenorrhea: Secondary | ICD-10-CM | POA: Diagnosis not present

## 2024-01-01 DIAGNOSIS — M25531 Pain in right wrist: Secondary | ICD-10-CM | POA: Diagnosis not present

## 2024-01-01 DIAGNOSIS — Z79899 Other long term (current) drug therapy: Secondary | ICD-10-CM | POA: Diagnosis not present

## 2024-01-01 DIAGNOSIS — M79642 Pain in left hand: Secondary | ICD-10-CM | POA: Diagnosis not present

## 2024-01-01 DIAGNOSIS — F17210 Nicotine dependence, cigarettes, uncomplicated: Secondary | ICD-10-CM | POA: Diagnosis not present

## 2024-01-01 DIAGNOSIS — M25512 Pain in left shoulder: Secondary | ICD-10-CM | POA: Diagnosis not present

## 2024-01-01 DIAGNOSIS — M25511 Pain in right shoulder: Secondary | ICD-10-CM | POA: Diagnosis not present

## 2024-01-01 DIAGNOSIS — G8929 Other chronic pain: Secondary | ICD-10-CM | POA: Diagnosis not present

## 2024-01-02 DIAGNOSIS — Z79899 Other long term (current) drug therapy: Secondary | ICD-10-CM | POA: Diagnosis not present

## 2024-03-27 DIAGNOSIS — M25531 Pain in right wrist: Secondary | ICD-10-CM | POA: Diagnosis not present

## 2024-03-27 DIAGNOSIS — Z79899 Other long term (current) drug therapy: Secondary | ICD-10-CM | POA: Diagnosis not present

## 2024-03-27 DIAGNOSIS — M25532 Pain in left wrist: Secondary | ICD-10-CM | POA: Diagnosis not present

## 2024-03-27 DIAGNOSIS — M25511 Pain in right shoulder: Secondary | ICD-10-CM | POA: Diagnosis not present

## 2024-03-27 DIAGNOSIS — N914 Secondary oligomenorrhea: Secondary | ICD-10-CM | POA: Diagnosis not present

## 2024-03-27 DIAGNOSIS — M25512 Pain in left shoulder: Secondary | ICD-10-CM | POA: Diagnosis not present

## 2024-03-27 DIAGNOSIS — G8929 Other chronic pain: Secondary | ICD-10-CM | POA: Diagnosis not present

## 2024-03-27 DIAGNOSIS — I10 Essential (primary) hypertension: Secondary | ICD-10-CM | POA: Diagnosis not present

## 2024-03-27 DIAGNOSIS — F17210 Nicotine dependence, cigarettes, uncomplicated: Secondary | ICD-10-CM | POA: Diagnosis not present

## 2024-03-27 DIAGNOSIS — E66811 Obesity, class 1: Secondary | ICD-10-CM | POA: Diagnosis not present

## 2024-03-27 DIAGNOSIS — M79641 Pain in right hand: Secondary | ICD-10-CM | POA: Diagnosis not present

## 2024-03-27 DIAGNOSIS — M1990 Unspecified osteoarthritis, unspecified site: Secondary | ICD-10-CM | POA: Diagnosis not present

## 2024-04-07 ENCOUNTER — Telehealth: Payer: Self-pay | Admitting: Neurology

## 2024-04-07 NOTE — Telephone Encounter (Signed)
 Patient complaining of pressure in head, moment of dizziness, pain in head when lying down. Experiencing for 2 to 3 weeks. Ask patient did she want to schedule an appointment with Dr. Buck she said no; I saw a man doctor there that did my surgery. I informed patient that would have been [facility]. You also had an appointment with Dr. Gregg for headaches that was cancelled, but you have not seen him. Do you want to schedule an appointment, patient stated no. Offered to give the phone number to [facility], she said she said she already have it. Then we hung up.

## 2024-04-07 NOTE — Telephone Encounter (Signed)
 Error

## 2024-04-07 NOTE — Telephone Encounter (Addendum)
 Noted.  Looks like wanted [facility] at this time.

## 2024-04-25 DIAGNOSIS — M25531 Pain in right wrist: Secondary | ICD-10-CM | POA: Diagnosis not present

## 2024-04-25 DIAGNOSIS — E669 Obesity, unspecified: Secondary | ICD-10-CM | POA: Diagnosis not present

## 2024-04-25 DIAGNOSIS — M25532 Pain in left wrist: Secondary | ICD-10-CM | POA: Diagnosis not present

## 2024-04-25 DIAGNOSIS — M79642 Pain in left hand: Secondary | ICD-10-CM | POA: Diagnosis not present

## 2024-04-25 DIAGNOSIS — N914 Secondary oligomenorrhea: Secondary | ICD-10-CM | POA: Diagnosis not present

## 2024-04-25 DIAGNOSIS — F17210 Nicotine dependence, cigarettes, uncomplicated: Secondary | ICD-10-CM | POA: Diagnosis not present

## 2024-04-25 DIAGNOSIS — M1990 Unspecified osteoarthritis, unspecified site: Secondary | ICD-10-CM | POA: Diagnosis not present

## 2024-04-25 DIAGNOSIS — Z79899 Other long term (current) drug therapy: Secondary | ICD-10-CM | POA: Diagnosis not present

## 2024-04-25 DIAGNOSIS — M25511 Pain in right shoulder: Secondary | ICD-10-CM | POA: Diagnosis not present

## 2024-04-25 DIAGNOSIS — G8929 Other chronic pain: Secondary | ICD-10-CM | POA: Diagnosis not present

## 2024-04-25 DIAGNOSIS — M25512 Pain in left shoulder: Secondary | ICD-10-CM | POA: Diagnosis not present

## 2024-04-25 DIAGNOSIS — M79641 Pain in right hand: Secondary | ICD-10-CM | POA: Diagnosis not present

## 2024-04-28 DIAGNOSIS — Z79899 Other long term (current) drug therapy: Secondary | ICD-10-CM | POA: Diagnosis not present

## 2024-06-16 ENCOUNTER — Other Ambulatory Visit: Payer: Self-pay | Admitting: Family Medicine

## 2024-06-16 NOTE — Telephone Encounter (Signed)
 Copied from CRM #[id number]. Topic: Clinical - Medication Refill >> Jun 16, 2024 12:01 PM Delon T wrote: Medication: amLODipine  (NORVASC ) 10 MG tablet hydrochlorothiazide  (HYDRODIURIL ) 25 MG tablet pantoprazole  (PROTONIX ) 20 MG tablet  Has the patient contacted their pharmacy? No (Agent: If no, request that the patient contact the pharmacy for the refill. If patient does not wish to contact the pharmacy document the reason why and proceed with request.) (Agent: If yes, when and what did the pharmacy advise?)  This is the patient's preferred pharmacy:  [facility] [address] ([address] [place] [address] [place] ([place]) [place] [address] Phone: [phone] Fax: [phone]  Is this the correct pharmacy for this prescription? Yes If no, delete pharmacy and type the correct one.   Has the prescription been filled recently? Yes  Is the patient out of the medication? Yes  Has the patient been seen for an appointment in the last year OR does the patient have an upcoming appointment? Yes  Can we respond through MyChart? Yes  Agent: Please be advised that Rx refills may take up to 3 business days. We ask that you follow-up with your pharmacy.

## 2024-06-17 ENCOUNTER — Other Ambulatory Visit: Payer: Self-pay

## 2024-06-17 ENCOUNTER — Emergency Department (HOSPITAL_BASED_OUTPATIENT_CLINIC_OR_DEPARTMENT_OTHER)

## 2024-06-17 ENCOUNTER — Emergency Department (HOSPITAL_BASED_OUTPATIENT_CLINIC_OR_DEPARTMENT_OTHER)
Admission: EM | Admit: 2024-06-17 | Discharge: 2024-06-17 | Disposition: A | Discharge status: Home / Self Care | Attending: Emergency Medicine | Admitting: Emergency Medicine

## 2024-06-17 ENCOUNTER — Encounter (HOSPITAL_BASED_OUTPATIENT_CLINIC_OR_DEPARTMENT_OTHER): Payer: Self-pay | Admitting: Emergency Medicine

## 2024-06-17 DIAGNOSIS — R10A1 Flank pain, right side: Secondary | ICD-10-CM | POA: Insufficient documentation

## 2024-06-17 DIAGNOSIS — I10 Essential (primary) hypertension: Secondary | ICD-10-CM | POA: Diagnosis not present

## 2024-06-17 LAB — CBC
HCT: 40.3 % (ref 36.0–46.0)
Hemoglobin: 13.5 g/dL (ref 12.0–15.0)
MCH: 26.7 pg (ref 26.0–34.0)
MCHC: 33.5 g/dL (ref 30.0–36.0)
MCV: 79.8 fL — ABNORMAL LOW (ref 80.0–100.0)
Platelets: 234 K/uL (ref 150–400)
RBC: 5.05 MIL/uL (ref 3.87–5.11)
RDW: 14.3 % (ref 11.5–15.5)
WBC: 8.8 K/uL (ref 4.0–10.5)
nRBC: 0 % (ref 0.0–0.2)

## 2024-06-17 LAB — URINALYSIS, ROUTINE W REFLEX MICROSCOPIC
Bacteria, UA: NONE SEEN
Bilirubin Urine: NEGATIVE
Glucose, UA: NEGATIVE mg/dL
Ketones, ur: NEGATIVE mg/dL
Leukocytes,Ua: NEGATIVE
Nitrite: NEGATIVE
Protein, ur: NEGATIVE mg/dL
Specific Gravity, Urine: 1.019 (ref 1.005–1.030)
pH: 6 (ref 5.0–8.0)

## 2024-06-17 LAB — BASIC METABOLIC PANEL WITH GFR
Anion gap: 11 (ref 5–15)
BUN: 7 mg/dL (ref 6–20)
CO2: 26 mmol/L (ref 22–32)
Calcium: 10.2 mg/dL (ref 8.9–10.3)
Chloride: 99 mmol/L (ref 98–111)
Creatinine, Ser: 0.68 mg/dL (ref 0.44–1.00)
GFR, Estimated: >60 mL/min (ref >60)
Glucose, Bld: 104 mg/dL — ABNORMAL HIGH (ref 70–99)
Potassium: 3.5 mmol/L (ref 3.5–5.1)
Sodium: 136 mmol/L (ref 135–145)

## 2024-06-17 LAB — PREGNANCY, URINE: Preg Test, Ur: NEGATIVE

## 2024-06-17 NOTE — ED Provider Notes (Signed)
 [place] [facility] Provider Note   CSN: [id number] Arrival date & time: 06/17/24  1914     Patient presents with: Flank Pain   Kellie Bautista is a 43 y.o. female    43 year old female presents to ED with complaints of right flank pain radiating down her side since Monday.  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  Patient reports right flank pain and increased urine frequency.  Patient denies any nausea vomiting chest pain shortness of breath or abdominal pain.  Patient has history of diverticulitis and reports she typically has loose stools but has not noted any changes to her bowel movements or blood in her bowels.  Patient has not taken anything for the pain.  Patient also reports some right shoulder pain which has been going on for several months which increases in severity when she uses it a lot at work.  Patient has relief of symptoms with ibuprofen  and Tylenol .  Patient has significant history of hypertension with noncompliance with medication.   Prior to Admission medications   Medication Sig Start Date End Date Taking? Authorizing Provider  amLODipine  (NORVASC ) 10 MG tablet Take 1 tablet (10 mg total) by mouth daily. 07/16/23   Tanda Bleacher, MD  hydrochlorothiazide  (HYDRODIURIL ) 25 MG tablet Take 1 tablet (25 mg total) by mouth daily. 07/16/23   Tanda Bleacher, MD  ondansetron  (ZOFRAN ) 4 MG tablet Take 1 tablet (4 mg total) by mouth every 8 (eight) hours as needed for nausea or vomiting. 09/06/23   Bryn Bernardino NOVAK, MD  oxyCODONE  (OXY IR/ROXICODONE ) 5 MG immediate release tablet Take 1 tablet (5 mg total) by mouth every 6 (six) hours as needed for moderate pain (pain score 4-6) or severe pain (pain score 7-10). 09/06/23   Bryn Bernardino NOVAK, MD  pantoprazole  (PROTONIX ) 20 MG tablet Take 1 tablet (20 mg total) by mouth daily. 07/16/23   Tanda Bleacher, MD     Allergies: Shrimp (diagnostic)    Review of Systems  Genitourinary:  Positive for flank pain and frequency.  Musculoskeletal:  Positive for arthralgias.  All other systems reviewed and are negative.   Updated Vital Signs BP (!) 161/111   Pulse 86   Temp 98.1 F (36.7 C) (Oral)   Resp 16   Ht 5' 4 (1.626 m)   Wt 88 kg   SpO2 96%   BMI 33.30 kg/m   Physical Exam Vitals and nursing note reviewed.  Constitutional:      Appearance: Normal appearance.  HENT:     Head: Normocephalic and atraumatic.     Nose: Nose normal.  Eyes:     Extraocular Movements: Extraocular movements intact.     Conjunctiva/sclera: Conjunctivae normal.     Pupils: Pupils are equal, round, and reactive to light.  Cardiovascular:     Rate and Rhythm: Normal rate.  Pulmonary:     Effort: Pulmonary effort is normal. No respiratory distress.  Musculoskeletal:        General: Tenderness present. No swelling. Normal range of motion.     Cervical back: Normal range of motion. No rigidity.     Right lower leg: No edema.     Left lower leg: No edema.  Skin:    General: Skin is warm.     Capillary Refill: Capillary refill takes less than 2 seconds.  Neurological:     General: No focal deficit present.  Mental Status: She is alert.  Psychiatric:        Mood and Affect: Mood normal.        Behavior: Behavior normal.     (all labs ordered are listed, but only abnormal results are displayed) Labs Reviewed  URINALYSIS, ROUTINE W REFLEX MICROSCOPIC - Abnormal; Notable for the following components:      Result Value   Hgb urine dipstick MODERATE (*)    All other components within normal limits  BASIC METABOLIC PANEL WITH GFR - Abnormal; Notable for the following components:   Glucose, Bld 104 (*)    All other components within normal limits  CBC - Abnormal; Notable for the following components:   MCV 79.8 (*)    All other components within normal limits  PREGNANCY, URINE     EKG: None  Radiology: CT Renal Stone Study Result Date: 06/17/2024 CLINICAL DATA:  Right flank pain EXAM: CT ABDOMEN AND PELVIS WITHOUT CONTRAST TECHNIQUE: Multidetector CT imaging of the abdomen and pelvis was performed following the standard protocol without IV contrast. RADIATION DOSE REDUCTION: This exam was performed according to the departmental dose-optimization program which includes automated exposure control, adjustment of the mA and/or kV according to patient size and/or use of iterative reconstruction technique. COMPARISON:  09/09/2023 FINDINGS: Lower chest: No acute abnormality Hepatobiliary: No focal hepatic abnormality. Gallbladder unremarkable. Pancreas: No focal abnormality or ductal dilatation. Spleen: No focal abnormality.  Normal size. Adrenals/Urinary Tract: No adrenal abnormality. No focal renal abnormality. No stones or hydronephrosis. Urinary bladder is unremarkable. Stomach/Bowel: Stomach is within normal limits. Appendix appears normal. No evidence of bowel wall thickening, distention, or inflammatory changes. Vascular/Lymphatic: No evidence of aneurysm or adenopathy. Aortic atherosclerosis. Reproductive: Uterus and adnexa unremarkable.  No mass. Other: No free fluid or free air. Musculoskeletal: No acute or significant osseous findings. IMPRESSION: No renal or ureteral stones.  No hydronephrosis. Aortic atherosclerosis. No acute findings. Electronically Signed   By: Franky Crease M.D.   On: 06/17/2024 21:12     Procedures   Medications Ordered in the ED - No data to display  43 y.o. female presents to the ED with complaints of rt flank pain, this involves an extensive number of treatment options, and is a complaint that carries with it a high risk of complications and morbidity.  The differential diagnosis includes UTI, nephrolithiasis, hydronephrosis, muscle strain, pyelonephritis,  (Ddx)  On arrival pt is nontoxic, vitals significant for HTN, no other abnormalities.  Exam significant for rt flank pain to palpation  Lab Tests:  I Ordered, reviewed, and interpreted labs, which included: CBC, CMP, UA, pregnancy   Imaging Studies ordered:  I ordered imaging studies which included CT renal, I independently visualized and interpreted imaging which showed no acute abnormalities     ED Course:   43 y/o female presents with rt flank pain since Monday. Pt has had reported hematuria and increased frequency without any other remarkable symptoms. She is sitting comfortably in ED bed in no distress and non toxic appearing. Pt is ambulatory without difficulty. Pain to palpation in the right flank that radiates toward axillary line. No tenderness to lumbar spine, recent trauma, or illnesses.   CT renal showed no kidney abnormality. UA was not indicative of UTI. No other concerning findings on lab work. Patient was able to stand and walk around which worsened the pain on certain movements. Patient reports she sits a lot at work and is right handed dominant. With movement increasing pain and patient having no other  concerning findings this pain may be related to musculoskeletal pain vs kidney etiology. Patient was advised to be compliant with BP meds to reduce blood pressure and to follow up with her primary care. She has an appointment tomorrow which she is encouraged to still go to for further evaluation and BP control. Patient agrees with treatment plan and comfortable with discharge. Return precautions discussed with patient   Portions of this note were generated with Dragon dictation software. Dictation errors may occur despite best attempts at proofreading.   Final diagnoses:  Right flank pain    ED Discharge Orders     None          Myriam Fonda GORMAN DEVONNA 06/19/24 0840    Randol Simmonds, MD 06/20/24 [phone]

## 2024-06-17 NOTE — ED Notes (Signed)
 Pt d/c instructions, medications, and follow-up care reviewed with pt. Pt verbalized understanding and had no further questions at time of d/c. Pt CA&Ox4, ambulatory, and in NAD at time of d/c

## 2024-06-17 NOTE — Discharge Instructions (Signed)
 Imaging and lab work are reassuring today.  It is advised to use ibuprofen  and muscle relaxers as needed for pain.  Follow-up with your established primary care appointment tomorrow.  If symptoms worsen or other concerning symptoms arise please return to the ED for further evaluation.  Some of these symptoms would include fever, chest pain, shortness of breath, worsening pain, fainting, or dizziness.

## 2024-06-17 NOTE — ED Triage Notes (Signed)
 Pt via POV c/o right flank pain since Monday with recent UA shows abnormal values. Pt also reports frequency and slight chills. No prior renal stones but pt has had severe UTIs in the past.

## 2024-06-18 ENCOUNTER — Ambulatory Visit

## 2024-06-18 NOTE — Telephone Encounter (Signed)
 Requested medication (s) are due for refill today: yes   Requested medication (s) are on the active medication list: yes   Last refill:  hydrodiuril , norvasc - 07/15/24 #90 1 refills, protonix - 07/16/23 #30 1 refills  Future visit scheduled: today   Notes to clinic:  do you want to refill Rxs?     Requested Prescriptions  Pending Prescriptions Disp Refills   hydrochlorothiazide  (HYDRODIURIL ) 25 MG tablet 90 tablet 1    Sig: Take 1 tablet (25 mg total) by mouth daily.     Cardiovascular: Diuretics - Thiazide Failed - 06/18/2024  1:36 PM      Failed - Last BP in normal range    BP Readings from Last 1 Encounters:  06/17/24 (!) 161/111         Failed - Valid encounter within last 6 months    Recent Outpatient Visits           7 months ago Essential hypertension   [place] [facility], [place]   11 months ago Essential hypertension   [place] [facility], MD   1 year ago Essential hypertension   [place] [facility], MD   1 year ago Essential hypertension   [place] [facility], MD       Future Appointments             Today [facility] [facility])             Passed - Cr in normal range and within 180 days    Creatinine, Ser  Date Value Ref Range Status  06/17/2024 0.68 0.44 - 1.00 mg/dL Final         Passed - K in normal range and within 180 days    Potassium  Date Value Ref Range Status  06/17/2024 3.5 3.5 - 5.1 mmol/L Final         Passed - Na in normal range and within 180 days    Sodium  Date Value Ref Range Status  06/17/2024 136 135 - 145 mmol/L Final  10/31/2023 142 134 - 144 mmol/L Final          pantoprazole  (PROTONIX ) 20 MG tablet 30 tablet 1    Sig: Take 1 tablet (20 mg total) by mouth daily.     Gastroenterology: Proton Pump Inhibitors Passed - 06/18/2024  1:36 PM       Passed - Valid encounter within last 12 months    Recent Outpatient Visits           7 months ago Essential hypertension   [place] [facility], MD   11 months ago Essential hypertension   [place] [facility], MD   1 year ago Essential hypertension   [place] [facility], MD   1 year ago Essential hypertension    [facility] at [facility], MD       Future Appointments             Today  [facility])              amLODipine  (NORVASC ) 10 MG tablet 90 tablet 1    Sig: Take 1 tablet (10 mg total) by mouth daily.     Cardiovascular: Calcium  Channel Blockers 2 Failed - 06/18/2024  1:36 PM      Failed - Last BP in normal range    BP Readings from Last 1 Encounters:  06/17/24 (!) 161/111         Failed - Valid encounter within last 6 months    Recent Outpatient Visits           7 months ago Essential hypertension   [place] [facility], MD   11 months ago Essential hypertension   [place] [facility], MD   1 year ago Essential hypertension   [place] [facility], MD   1 year ago Essential hypertension   [place] [facility], MD       Future Appointments             Today [place] [facility]             Passed - Last Heart Rate in normal range    Pulse Readings from Last 1 Encounters:  06/17/24 86

## 2024-06-26 MED ORDER — PANTOPRAZOLE SODIUM 20 MG PO TBEC: 20.0000 mg | DELAYED_RELEASE_TABLET | Freq: Every day | ORAL | 0 refills | Status: AC

## 2024-06-26 MED ORDER — AMLODIPINE BESYLATE 10 MG PO TABS: 10.0000 mg | ORAL_TABLET | Freq: Every day | ORAL | 0 refills | Status: AC

## 2024-06-26 MED ORDER — HYDROCHLOROTHIAZIDE 25 MG PO TABS
25.0000 mg | ORAL_TABLET | Freq: Every day | ORAL | 0 refills | Status: AC
Start: 2024-06-26 — End: ?

## 2024-08-17 DIAGNOSIS — U071 COVID-19: Secondary | ICD-10-CM | POA: Diagnosis not present

## 2024-08-17 DIAGNOSIS — R509 Fever, unspecified: Secondary | ICD-10-CM | POA: Diagnosis not present

## 2024-08-17 DIAGNOSIS — J0110 Acute frontal sinusitis, unspecified: Secondary | ICD-10-CM | POA: Diagnosis not present
